# Patient Record
Sex: Female | Born: 1960
Health system: Southern US, Community
[De-identification: ages and names within clinical notes are randomized; demographics above are authoritative.]

## PROBLEM LIST (undated history)

## (undated) DIAGNOSIS — Z9889 Other specified postprocedural states: Secondary | ICD-10-CM

## (undated) DIAGNOSIS — R112 Nausea with vomiting, unspecified: Secondary | ICD-10-CM

## (undated) DIAGNOSIS — M1990 Unspecified osteoarthritis, unspecified site: Secondary | ICD-10-CM

## (undated) DIAGNOSIS — F419 Anxiety disorder, unspecified: Secondary | ICD-10-CM

## (undated) DIAGNOSIS — F329 Major depressive disorder, single episode, unspecified: Secondary | ICD-10-CM

## (undated) DIAGNOSIS — R7303 Prediabetes: Secondary | ICD-10-CM

## (undated) DIAGNOSIS — T4145XA Adverse effect of unspecified anesthetic, initial encounter: Secondary | ICD-10-CM

## (undated) DIAGNOSIS — T8859XA Other complications of anesthesia, initial encounter: Secondary | ICD-10-CM

## (undated) DIAGNOSIS — F32A Depression, unspecified: Secondary | ICD-10-CM

## (undated) DIAGNOSIS — K219 Gastro-esophageal reflux disease without esophagitis: Secondary | ICD-10-CM

## (undated) DIAGNOSIS — T7840XA Allergy, unspecified, initial encounter: Secondary | ICD-10-CM

## (undated) DIAGNOSIS — Z973 Presence of spectacles and contact lenses: Secondary | ICD-10-CM

## (undated) HISTORY — DX: Allergy, unspecified, initial encounter: T78.40XA

## (undated) HISTORY — DX: Gastro-esophageal reflux disease without esophagitis: K21.9

## (undated) HISTORY — DX: Major depressive disorder, single episode, unspecified: F32.9

## (undated) HISTORY — DX: Depression, unspecified: F32.A

## (undated) HISTORY — DX: Anxiety disorder, unspecified: F41.9

## (undated) HISTORY — PX: JOINT REPLACEMENT: SHX530

## (undated) HISTORY — DX: Unspecified osteoarthritis, unspecified site: M19.90

---

## 1987-11-23 HISTORY — PX: BREAST BIOPSY: SHX20

## 1999-05-05 ENCOUNTER — Other Ambulatory Visit: Admission: RE | Admit: 1999-05-05 | Discharge: 1999-05-05 | Payer: Self-pay | Admitting: Obstetrics & Gynecology

## 1999-08-25 ENCOUNTER — Encounter: Payer: Self-pay | Admitting: Obstetrics & Gynecology

## 1999-08-25 ENCOUNTER — Ambulatory Visit (HOSPITAL_COMMUNITY): Admission: RE | Admit: 1999-08-25 | Discharge: 1999-08-25 | Payer: Self-pay | Admitting: Obstetrics & Gynecology

## 2000-05-05 ENCOUNTER — Encounter (INDEPENDENT_AMBULATORY_CARE_PROVIDER_SITE_OTHER): Payer: Self-pay

## 2000-05-05 ENCOUNTER — Other Ambulatory Visit: Admission: RE | Admit: 2000-05-05 | Discharge: 2000-05-05 | Payer: Self-pay | Admitting: Obstetrics and Gynecology

## 2001-05-15 ENCOUNTER — Other Ambulatory Visit: Admission: RE | Admit: 2001-05-15 | Discharge: 2001-05-15 | Payer: Self-pay | Admitting: Obstetrics and Gynecology

## 2002-02-13 ENCOUNTER — Encounter: Payer: Self-pay | Admitting: Obstetrics and Gynecology

## 2002-02-13 ENCOUNTER — Ambulatory Visit (HOSPITAL_COMMUNITY): Admission: RE | Admit: 2002-02-13 | Discharge: 2002-02-13 | Payer: Self-pay | Admitting: Obstetrics and Gynecology

## 2002-04-24 ENCOUNTER — Ambulatory Visit (HOSPITAL_COMMUNITY): Admission: RE | Admit: 2002-04-24 | Discharge: 2002-04-24 | Payer: Self-pay | Admitting: Obstetrics and Gynecology

## 2002-04-24 ENCOUNTER — Encounter: Payer: Self-pay | Admitting: Obstetrics and Gynecology

## 2002-07-24 ENCOUNTER — Other Ambulatory Visit: Admission: RE | Admit: 2002-07-24 | Discharge: 2002-07-24 | Payer: Self-pay | Admitting: Obstetrics and Gynecology

## 2003-07-02 ENCOUNTER — Encounter: Payer: Self-pay | Admitting: Obstetrics and Gynecology

## 2003-07-02 ENCOUNTER — Ambulatory Visit (HOSPITAL_COMMUNITY): Admission: RE | Admit: 2003-07-02 | Discharge: 2003-07-02 | Payer: Self-pay | Admitting: Obstetrics and Gynecology

## 2003-07-09 ENCOUNTER — Encounter: Payer: Self-pay | Admitting: Obstetrics and Gynecology

## 2003-07-09 ENCOUNTER — Encounter: Admission: RE | Admit: 2003-07-09 | Discharge: 2003-07-09 | Payer: Self-pay | Admitting: Obstetrics and Gynecology

## 2003-08-05 ENCOUNTER — Other Ambulatory Visit: Admission: RE | Admit: 2003-08-05 | Discharge: 2003-08-05 | Payer: Self-pay | Admitting: Obstetrics and Gynecology

## 2004-09-23 ENCOUNTER — Ambulatory Visit (HOSPITAL_COMMUNITY): Admission: RE | Admit: 2004-09-23 | Discharge: 2004-09-23 | Payer: Self-pay | Admitting: Obstetrics and Gynecology

## 2004-11-03 ENCOUNTER — Other Ambulatory Visit: Admission: RE | Admit: 2004-11-03 | Discharge: 2004-11-03 | Payer: Self-pay | Admitting: Obstetrics and Gynecology

## 2006-01-12 ENCOUNTER — Ambulatory Visit (HOSPITAL_COMMUNITY): Admission: RE | Admit: 2006-01-12 | Discharge: 2006-01-12 | Payer: Self-pay | Admitting: Obstetrics and Gynecology

## 2006-01-12 ENCOUNTER — Other Ambulatory Visit: Admission: RE | Admit: 2006-01-12 | Discharge: 2006-01-12 | Payer: Self-pay | Admitting: Obstetrics and Gynecology

## 2007-01-19 ENCOUNTER — Ambulatory Visit (HOSPITAL_COMMUNITY): Admission: RE | Admit: 2007-01-19 | Discharge: 2007-01-19 | Payer: Self-pay | Admitting: Obstetrics and Gynecology

## 2008-02-19 ENCOUNTER — Ambulatory Visit (HOSPITAL_COMMUNITY): Admission: RE | Admit: 2008-02-19 | Discharge: 2008-02-19 | Payer: Self-pay | Admitting: Obstetrics and Gynecology

## 2009-03-13 ENCOUNTER — Ambulatory Visit (HOSPITAL_COMMUNITY): Admission: RE | Admit: 2009-03-13 | Discharge: 2009-03-13 | Payer: Self-pay | Admitting: Obstetrics and Gynecology

## 2010-09-07 ENCOUNTER — Ambulatory Visit (HOSPITAL_COMMUNITY): Admission: RE | Admit: 2010-09-07 | Discharge: 2010-09-07 | Payer: Self-pay | Admitting: Obstetrics and Gynecology

## 2010-12-13 ENCOUNTER — Encounter: Payer: Self-pay | Admitting: Obstetrics and Gynecology

## 2011-04-01 ENCOUNTER — Encounter: Payer: Self-pay | Admitting: Internal Medicine

## 2011-04-01 ENCOUNTER — Ambulatory Visit (INDEPENDENT_AMBULATORY_CARE_PROVIDER_SITE_OTHER): Payer: 59 | Admitting: Internal Medicine

## 2011-04-01 VITALS — BP 126/86 | HR 80 | Temp 98.6°F | Ht 66.0 in | Wt 227.2 lb

## 2011-04-01 DIAGNOSIS — R7309 Other abnormal glucose: Secondary | ICD-10-CM

## 2011-04-01 DIAGNOSIS — E162 Hypoglycemia, unspecified: Secondary | ICD-10-CM

## 2011-04-01 DIAGNOSIS — E161 Other hypoglycemia: Secondary | ICD-10-CM

## 2011-04-01 NOTE — Progress Notes (Signed)
  Subjective:    Patient ID: Tracy Pitts, female    DOB: 1961/07/24, 50 y.o.   MRN: [id number]  HPI Her fasting glucose was 140 on screening exam work in 07/2010. She has been monitoring glucoses with her mother 's glucometer. Fasting blood sugar range:101- 126 Fasting blood sugar average:not averaged Highest glucose 2 hours postprandially:<180 Hypoglycemia ; 52 with shaking 2 hrs after a Pop Tart   Polyuria/polydipsia/polyphagia:no Postural symptoms (lightheadedness):no Chest pain/palpitations/claudication:no Skin lesions/ulcers:no Numbness, tingling, burning extremities:numbness R thumb & index & middle fingers, related to head position, especially in ams Weight change:decreasing with TLC Vision change ( blurring, diplopia, vision loss):no Exercise:walking 1-2 mpd 2-3 X/ week Nutrition/diet:decreased sweets & carbs Medication compliance:on no meds Eye exam:02/2011, no retinopathy Footcare:no A1 C./urine microalbumin:none to date.  Family history diabetes colon and terminal grandmother. Tracy Pitts  had gestational diabetes.       Review of Systems     Objective:   Physical Exam Gen.: Healthy and well-nourished in appearance. Alert, appropriate and cooperative throughout exam. Neck: No deformities, masses, or tenderness noted. Range of motion  normal. Thyroid  Normal w/o nodules. Lungs: Normal respiratory effort; chest expands symmetrically. Lungs are clear to auscultation without rales, wheezes, or increased work of breathing. Heart: Normal rate and rhythm. Normal S1 and S2. No gallop, click, or rub. S4  w/o  murmur. No clubbing, cyanosis, edema, or deformity noted. Tone & strength  normal.Joints normal; insignificant DIP finger changes. Nail health  good. Vascular: Carotid, radial artery, dorsalis pedis and dorsalis posterior tibial pulses are full and equal. No bruits present. Neurologic: Alert and oriented x3. Deep tendon reflexes symmetrical and normal. Negative  Tinel's.  Light touch normal over feet.   Skin: Intact without suspicious lesions or rashes. Lymph: No cervical, axillary, or inguinal lymphadenopathy present. Psych: Mood and affect are normal. Normally interactive                                                                                         Assessment & Plan:  #1 fasting hyperglycemia  #2 reactive hypoglycemia with high  glycemic carb  intake   #3 intermittent  C6 radiculopathy without neuromuscular deficit  Plan: #1 A1 C., urine microalbumin  #2 low glycemic nutritional program such as the [employer] or [place].  #3 consider a memory foam cervical pillow to prevent C6 nerve impingement

## 2011-04-01 NOTE — Patient Instructions (Signed)
Preventive Health Care: Exercise  30-45  minutes a day, 3-4 days a week. Walking is especially valuable in preventing Osteoporosis. Eat a low-fat diet with lots of fruits and vegetables, up to 7-9 servings per day. Avoid obesity; your goal = waist less than 35 inches.Consume less than 30 grams of sugar per day from foods & drinks with High Fructose Corn Syrup as #2,3 or #4 on label. Diabetes Monitor   The A1c test checks the average amount of glucose (sugar) in the blood over the last 2 to 3 months.As glucose circulates in the blood, some of it binds to hemoglobin A. This is the main form of hemoglobin in adults. Hemoglobin is a red protein that carries oxygen in the red blood cells (RBC's). Once the glucose is bound to the hemoglobin A, it remains there for the life of the red blood cell (about 120 days). This combination of glucose and hemoglobin A is called A1c (or hemoglobin A1c or glycohemoglobin). Increased glucose in the blood, increases the hemoglobin A1c. A1c levels do not change quickly but will shift as older RBC's die and younger ones take their place.   The A1c test is used primarily to monitor the glucose control of diabetics over time. The goal of those with diabetes is to keep their blood glucose levels as close to normal as possible. This helps to minimize the complications caused by chronically elevated glucose levels, such as progressive damage to body organs like the kidneys, eyes, cardiovascular system, and nerves. The A1c test gives a picture of the average amount of glucose in the blood over the last few months. It can help a patient and his doctor know if the measures they are taking to control the patient's diabetes are successful or need to be adjusted.     Depending on the type of diabetes that you have, how well your diabetes is controlled, your A1c may be measured 2 to 4 times each year. When someone is first diagnosed with diabetes or if control is not good, A1c may be ordered  more frequently.   NORMAL VALUES  Non diabetic adults: 5 %-6.1%  Good diabetic control: 6.2-6.4 %  Fair diabetic control: 6.5-7%  Poor diabetic control: greater than 7 % ( except with additional factors such as  advanced age; significant coronary or neurologic disease,etc). Check the A1c every 6 months if it is < 6.5%; every 4 months if  6.5% or higher. Goals for home glucose monitoring are : fasting  or morning glucose goal of  90-150. Two hours after any meal , goal = < 180, preferably < 150.      Marland Kitchen

## 2011-04-02 LAB — HEMOGLOBIN A1C: Hgb A1c MFr Bld: 6.2 % (ref 4.6–6.5)

## 2011-04-02 LAB — MICROALBUMIN / CREATININE URINE RATIO: Creatinine,U: 190.5 mg/dL

## 2011-09-27 ENCOUNTER — Other Ambulatory Visit (HOSPITAL_COMMUNITY): Payer: Self-pay | Admitting: Obstetrics and Gynecology

## 2011-09-27 DIAGNOSIS — Z1231 Encounter for screening mammogram for malignant neoplasm of breast: Secondary | ICD-10-CM

## 2011-11-01 ENCOUNTER — Ambulatory Visit (HOSPITAL_COMMUNITY)
Admission: RE | Admit: 2011-11-01 | Discharge: 2011-11-01 | Disposition: A | Discharge status: Home / Self Care | Payer: 59 | Source: Ambulatory Visit | Attending: Obstetrics and Gynecology | Admitting: Obstetrics and Gynecology

## 2011-11-01 DIAGNOSIS — Z1231 Encounter for screening mammogram for malignant neoplasm of breast: Secondary | ICD-10-CM | POA: Insufficient documentation

## 2012-08-31 ENCOUNTER — Telehealth: Payer: Self-pay | Admitting: Internal Medicine

## 2012-08-31 ENCOUNTER — Encounter: Payer: Self-pay | Admitting: Internal Medicine

## 2012-08-31 ENCOUNTER — Ambulatory Visit (INDEPENDENT_AMBULATORY_CARE_PROVIDER_SITE_OTHER): Payer: 59 | Admitting: Internal Medicine

## 2012-08-31 VITALS — BP 130/80 | HR 71 | Temp 98.3°F | Wt 231.2 lb

## 2012-08-31 DIAGNOSIS — IMO0002 Reserved for concepts with insufficient information to code with codable children: Secondary | ICD-10-CM

## 2012-08-31 DIAGNOSIS — M5414 Radiculopathy, thoracic region: Secondary | ICD-10-CM

## 2012-08-31 MED ORDER — GABAPENTIN 100 MG PO CAPS: ORAL_CAPSULE | ORAL | Status: DC

## 2012-08-31 MED ORDER — FAMCICLOVIR 500 MG PO TABS: 500.0000 mg | ORAL_TABLET | Freq: Three times a day (TID) | ORAL | Status: DC

## 2012-08-31 NOTE — Telephone Encounter (Signed)
Spoke with patient, offered appointment today with Dr.Hopper

## 2012-08-31 NOTE — Patient Instructions (Addendum)
Assess response to the gabapentin one every 8 hours as needed. If it is partially beneficial, it can be increased up to a total of 3 pills every 8 hours as needed. This increase of 1 pill each dose  should take place over 48 hours at least.

## 2012-08-31 NOTE — Telephone Encounter (Signed)
Caller: Kim/Patient; Patient Name: Tracy Pitts; PCP: Marga Melnick; Best Callback Phone Number: [phone]. Onset 08/27/12 Patient states she has had right flank pain and was seen  by Dr Retta Diones and CT Scan negative for stones, UA negative.  Afebrile, nausea, right flank pain 5-6 on pain scale.  Dr Retta Diones advised patient to contact her PCP for follow up.  All emergent symptoms ruled out per Flank Pain protocol with exception " All other situations."  Home care advice given.  PLEASE F/U WITH PATIENT CONCERNING POSSIBLE APPT. THANK YOU.

## 2012-08-31 NOTE — Progress Notes (Signed)
  Subjective:    Patient ID: Tracy Pitts, female    DOB: [birth year], 51 y.o.   MRN: [id number]  HPI FLANK PAIN: Sudden onset of right flank constant pain 5-6/10, pain started on 08/27/12 as aching, colicky with burning and  radiation to R anterior abdomen. Worse while sitting and putting weight on right buttock.  Taking ibuprofen & percocet which was prescribed by Dr. Retta Diones which does not relieve the pain. No relief with heating pad.  She states that her clothes tend to exacerbate the burning sensation intermittently  CT urogram and urinalysis were negative by the urologist  Childhood chicken pox.        Review of Systems  Nausea/Vomiting:  Waves of nausea but denies emesis Diarrhea: No Constipation: No Melena/BRBPR: No Hematemesis: No Anorexia:No Fever/Chills: Denies fever;she  had chills on Monday at work Dysuria/Hematuria/Pyuria: No Rash: No Wt loss: No Vaginal bleeding: No, last pap February 2013 Past Surgeries: C-Section 1994, Breast biopsy 1990 benign cyst         Objective:   Physical Exam General appearance is one of good health and nourishment w/o distress.  Eyes: No conjunctival inflammation or scleral icterus is present.  Oral exam: Dental hygiene is good; lips and gums are healthy appearing.There is no oropharyngeal erythema or exudate noted.  Mucocoele of uvula  Heart:  Normal rate and regular rhythm. S1 and S2 normal without gallop, murmur, click, rub or other extra sounds     Lungs:Chest clear to auscultation; no wheezes, rhonchi,rales ,or rubs present.No increased work of breathing.   Abdomen: bowel sounds normal, soft and non-tender without masses, organomegaly or hernias noted.  No guarding or rebound   Skin:Warm & dry.  Intact without suspicious lesions or rashes ; no jaundice or tenting  Lymphatic: No lymphadenopathy is noted about the head, neck, axilla areas.   Musculoskeletal/Extremities: Gait including tiptoe and heel walking; range of  motion; stability; muscle strength; and  muscle tone are normal. Nail health is good. There are  no significant deformities of the digits. No cyanosis, clubbing or edema are present.   She is able to lie flat and sit up without help. Negative straight leg raising bilaterally             Assessment & Plan:  #1 right flank pain with abdominal radiation. Clinical exam does not suggest thoracic radiculopathy. The history is suggestive of zoster; but there is no rash present. This is also suggested by the sensitivity of the area to her clothing and lack of significant response to the narcotic.  Plan: See orders and recommendations

## 2012-09-08 ENCOUNTER — Encounter: Payer: Self-pay | Admitting: Internal Medicine

## 2012-09-08 NOTE — Telephone Encounter (Signed)
Patient states she is feeling better although she is still broken out and has some pain. She would like to go back to work tomorrow and wants to know if that would be okay. Call pt back @ [phone]

## 2012-09-08 NOTE — Telephone Encounter (Signed)
Spoke with pt. She states she is going to keep taking the gabapentin prn. She states she feels a littler better. Pt states she will talk to her work & give [place] a call on Monday to let us know how she is & to check on the work note/fmla.

## 2012-09-08 NOTE — Telephone Encounter (Signed)
Left msg on pt's vmail calling to check on her per dr. Alwyn Ren. (staff msg)

## 2012-09-14 ENCOUNTER — Telehealth: Payer: Self-pay | Admitting: Internal Medicine

## 2012-09-14 NOTE — Telephone Encounter (Signed)
Paperwork was completed by MD and given to Amy for completion process

## 2012-09-14 NOTE — Telephone Encounter (Signed)
PATIENT called today needs to know status of FMLA paperwork dropped off 10.14.13---call back# is work# [phone]

## 2012-09-14 NOTE — Telephone Encounter (Signed)
On desk top  

## 2012-09-14 NOTE — Telephone Encounter (Signed)
Dr.Hopper please advise, I have not seen any FMLA paperwork on this patient

## 2012-12-04 ENCOUNTER — Other Ambulatory Visit (HOSPITAL_COMMUNITY): Payer: Self-pay | Admitting: Obstetrics and Gynecology

## 2012-12-04 DIAGNOSIS — Z1231 Encounter for screening mammogram for malignant neoplasm of breast: Secondary | ICD-10-CM

## 2012-12-12 ENCOUNTER — Ambulatory Visit (HOSPITAL_COMMUNITY): Payer: 59

## 2012-12-26 ENCOUNTER — Ambulatory Visit (HOSPITAL_COMMUNITY): Payer: 59

## 2013-01-01 ENCOUNTER — Encounter: Payer: Self-pay | Admitting: Internal Medicine

## 2013-01-06 ENCOUNTER — Other Ambulatory Visit: Payer: Self-pay

## 2013-01-10 ENCOUNTER — Ambulatory Visit (HOSPITAL_COMMUNITY): Payer: 59

## 2013-02-08 ENCOUNTER — Ambulatory Visit (AMBULATORY_SURGERY_CENTER): Payer: 59 | Admitting: *Deleted

## 2013-02-08 VITALS — Ht 66.0 in | Wt 235.8 lb

## 2013-02-08 MED ORDER — PEG-KCL-NACL-NASULF-NA ASC-C 100 G PO SOLR: ORAL | Status: DC

## 2013-02-08 NOTE — Progress Notes (Signed)
No egg or soy allergy 

## 2013-02-16 ENCOUNTER — Ambulatory Visit
Admission: RE | Admit: 2013-02-16 | Discharge: 2013-02-16 | Disposition: A | Discharge status: Home / Self Care | Payer: 59 | Source: Ambulatory Visit | Attending: Obstetrics and Gynecology | Admitting: Obstetrics and Gynecology

## 2013-02-16 DIAGNOSIS — Z1231 Encounter for screening mammogram for malignant neoplasm of breast: Secondary | ICD-10-CM

## 2013-02-21 ENCOUNTER — Ambulatory Visit (AMBULATORY_SURGERY_CENTER): Payer: 59 | Admitting: Internal Medicine

## 2013-02-21 ENCOUNTER — Encounter: Payer: Self-pay | Admitting: Internal Medicine

## 2013-02-21 VITALS — BP 133/66 | HR 65 | Temp 98.2°F | Resp 10 | Ht 66.0 in | Wt 235.0 lb

## 2013-02-21 DIAGNOSIS — Z1211 Encounter for screening for malignant neoplasm of colon: Secondary | ICD-10-CM

## 2013-02-21 DIAGNOSIS — K5730 Diverticulosis of large intestine without perforation or abscess without bleeding: Secondary | ICD-10-CM

## 2013-02-21 HISTORY — PX: COLONOSCOPY WITH PROPOFOL: SHX5780

## 2013-02-21 MED ORDER — SODIUM CHLORIDE 0.9 % IV SOLN: 500.0000 mL | INTRAVENOUS | Status: DC

## 2013-02-21 NOTE — Op Note (Signed)
[facility] [address] [place], [address]   COLONOSCOPY PROCEDURE REPORT  PATIENT: Tracy, Pitts  MR#: [id number] BIRTHDATE: 1961/08/03 , 52  yrs. old GENDER: Female ENDOSCOPIST: Roxy Cedar, MD REFERRED NF:AOZHY Richardson, M.D. PROCEDURE DATE:  02/21/2013 PROCEDURE:   Colonoscopy, screening ASA CLASS:   Class II INDICATIONS:average risk screening. MEDICATIONS: MAC sedation, administered by CRNA and propofol (Diprivan) 400mg  IV  DESCRIPTION OF PROCEDURE:   After the risks benefits and alternatives of the procedure were thoroughly explained, informed consent was obtained.  A digital rectal exam revealed no abnormalities of the rectum.   The LB [id number]  endoscope was introduced through the anus and advanced to the cecum, which was identified by both the appendix and ileocecal valve. No adverse events experienced.   The quality of the prep was good, using MoviPrep  The instrument was then slowly withdrawn as the colon was fully examined.      COLON FINDINGS: Moderate diverticulosis was noted in the sigmoid colon.   The colon was otherwise normal.  There was no inflammation, polyps or cancers .  Retroflexed views revealed internal hemorrhoids. The time to cecum=2 minutes 46 seconds. Withdrawal time=8 minutes 33 seconds.  The scope was withdrawn and the procedure completed.  COMPLICATIONS: There were no complications.  ENDOSCOPIC IMPRESSION: 1.   Moderate diverticulosis was noted in the sigmoid colon 2.   The colon was otherwise normal  RECOMMENDATIONS: 1. Continue current colorectal screening recommendations for "routine risk" patients with a repeat colonoscopy in 10 years.   eSigned:  Roxy Cedar, MD 02/21/2013 9:25 AM   cc: Huel Cote, MD, Pecola Lawless, MD, and The Patient

## 2013-02-21 NOTE — Progress Notes (Signed)
Patient did not experience any of the following events: a burn prior to discharge; a fall within the facility; wrong site/side/patient/procedure/implant event; or a hospital transfer or hospital admission upon discharge from the facility. (G8907) Patient did not have preoperative order for IV antibiotic SSI prophylaxis. (G8918)  

## 2013-02-21 NOTE — Patient Instructions (Addendum)

## 2013-02-22 ENCOUNTER — Telehealth: Payer: Self-pay | Admitting: *Deleted

## 2013-02-22 NOTE — Telephone Encounter (Signed)
a Follow up Call-  Call back number 02/21/2013  Post procedure Call Back phone  # [phone]  Permission to leave phone message Yes     Patient questions:  Do you have a fever, pain , or abdominal swelling? no Pain Score  0 *  Have you tolerated food without any problems? yes  Have you been able to return to your normal activities? yes  Do you have any questions about your discharge instructions: Diet   no Medications  no Follow up visit  no  Do you have questions or concerns about your Care? no  Actions: * If pain score is 4 or above: No action needed, pain <4.

## 2013-09-27 ENCOUNTER — Other Ambulatory Visit: Payer: Self-pay

## 2014-12-25 ENCOUNTER — Encounter: Payer: Self-pay | Admitting: Internal Medicine

## 2014-12-25 ENCOUNTER — Other Ambulatory Visit: Payer: Self-pay | Admitting: Internal Medicine

## 2014-12-25 ENCOUNTER — Other Ambulatory Visit (INDEPENDENT_AMBULATORY_CARE_PROVIDER_SITE_OTHER): Payer: 59

## 2014-12-25 ENCOUNTER — Ambulatory Visit (INDEPENDENT_AMBULATORY_CARE_PROVIDER_SITE_OTHER): Payer: 59 | Admitting: Internal Medicine

## 2014-12-25 VITALS — BP 132/98 | HR 72 | Temp 98.3°F | Resp 15 | Ht 66.0 in | Wt 234.0 lb

## 2014-12-25 DIAGNOSIS — Z0000 Encounter for general adult medical examination without abnormal findings: Secondary | ICD-10-CM

## 2014-12-25 DIAGNOSIS — R7309 Other abnormal glucose: Secondary | ICD-10-CM

## 2014-12-25 DIAGNOSIS — R1010 Upper abdominal pain, unspecified: Secondary | ICD-10-CM

## 2014-12-25 DIAGNOSIS — F419 Anxiety disorder, unspecified: Secondary | ICD-10-CM

## 2014-12-25 DIAGNOSIS — R1011 Right upper quadrant pain: Secondary | ICD-10-CM

## 2014-12-25 DIAGNOSIS — Z0189 Encounter for other specified special examinations: Secondary | ICD-10-CM

## 2014-12-25 DIAGNOSIS — F329 Major depressive disorder, single episode, unspecified: Secondary | ICD-10-CM | POA: Insufficient documentation

## 2014-12-25 DIAGNOSIS — R7303 Prediabetes: Secondary | ICD-10-CM | POA: Insufficient documentation

## 2014-12-25 LAB — MICROALBUMIN / CREATININE URINE RATIO
Creatinine,U: 61.5 mg/dL
MICROALB/CREAT RATIO: 1.1 mg/g (ref 0.0–30.0)
Microalb, Ur: <0.7 mg/dL (ref 0.0–1.9)

## 2014-12-25 LAB — CBC WITH DIFFERENTIAL/PLATELET
BASOS PCT: 0.6 % (ref 0.0–3.0)
Basophils Absolute: 0 10*3/uL (ref 0.0–0.1)
Eosinophils Absolute: 0.1 10*3/uL (ref 0.0–0.7)
Eosinophils Relative: 2.2 % (ref 0.0–5.0)
HCT: 42.8 % (ref 36.0–46.0)
HEMOGLOBIN: 14.8 g/dL (ref 12.0–15.0)
LYMPHS ABS: 1.7 10*3/uL (ref 0.7–4.0)
Lymphocytes Relative: 31.7 % (ref 12.0–46.0)
MCHC: 34.4 g/dL (ref 30.0–36.0)
MCV: 90.3 fl (ref 78.0–100.0)
MONOS PCT: 8.2 % (ref 3.0–12.0)
Monocytes Absolute: 0.5 10*3/uL (ref 0.1–1.0)
Neutro Abs: 3.1 10*3/uL (ref 1.4–7.7)
Neutrophils Relative %: 57.3 % (ref 43.0–77.0)
PLATELETS: 260 10*3/uL (ref 150.0–400.0)
RBC: 4.74 Mil/uL (ref 3.87–5.11)
RDW: 13.6 % (ref 11.5–15.5)
WBC: 5.5 10*3/uL (ref 4.0–10.5)

## 2014-12-25 LAB — BASIC METABOLIC PANEL
BUN: 16 mg/dL (ref 6–23)
CO2: 29 mEq/L (ref 19–32)
CREATININE: 0.84 mg/dL (ref 0.40–1.20)
Calcium: 9.4 mg/dL (ref 8.4–10.5)
Chloride: 105 mEq/L (ref 96–112)
GFR: 75.4 mL/min (ref >60.00)
Glucose, Bld: 97 mg/dL (ref 70–99)
Potassium: 4.7 mEq/L (ref 3.5–5.1)
Sodium: 139 mEq/L (ref 135–145)

## 2014-12-25 LAB — HEPATIC FUNCTION PANEL
ALBUMIN: 4.3 g/dL (ref 3.5–5.2)
ALT: 85 U/L — ABNORMAL HIGH (ref 0–35)
AST: 21 U/L (ref 0–37)
Alkaline Phosphatase: 78 U/L (ref 39–117)
Bilirubin, Direct: 0 mg/dL (ref 0.0–0.3)
Total Bilirubin: 0.4 mg/dL (ref 0.2–1.2)
Total Protein: 7.1 g/dL (ref 6.0–8.3)

## 2014-12-25 LAB — HEMOGLOBIN A1C: HEMOGLOBIN A1C: 6.1 % (ref 4.6–6.5)

## 2014-12-25 LAB — TSH: TSH: 2.39 u[IU]/mL (ref 0.35–4.50)

## 2014-12-25 NOTE — Progress Notes (Signed)
Pre visit review using our clinic review tool, if applicable. No additional management support is needed unless otherwise documented below in the visit note. 

## 2014-12-25 NOTE — Patient Instructions (Signed)
Your next office appointment will be determined based upon review of your pending labs & ultrasound. Those instructions will be transmitted to you through My Chart  Critical values will be called   Followup as needed for your acute issue. Please report any significant change in your symptoms. Reflux of gastric acid may be asymptomatic as this may occur mainly during sleep.The triggers for reflux  include stress; the "aspirin family" ; alcohol; peppermint; and caffeine (coffee, tea, cola, and chocolate). The aspirin family would include aspirin and the nonsteroidal agents such as ibuprofen &  Naproxen. Tylenol would not cause reflux. If having symptoms ; food & drink should be avoided for @ least 2 hours before going to bed.

## 2014-12-25 NOTE — Progress Notes (Signed)
Subjective:    Patient ID: Tracy Pitts, female    DOB: 1961-08-21, 54 y.o.   MRN: [id number]  HPI   She is here for a physical;acute issues include abdominal pain.  She has had intermittent upper abdominal pain across the abdomen;especially in RUQ 4-5 X over past 2-3 mos. The trigger is eating fried foods. The pain can last hours; most recently 1/29-1/31/16. Zantac & TUMS did not help. She denies dysphagia, unexplained weight loss, melena, or rectal bleeding.  Her A1c was in the prediabetic range at 6.1% her gynecologist. Originally metformin 500 mg twice a day was prescribed but she felt she was having some hypoglycemia. She is now on 500 mg after breakfast.. She is not monitoring glucoses at home.She does have a past history of gestational diabetes.  She is on a low-fat diet. She walks 3 times a week 60-90 minutes without cardiopulmonary symptoms. She does have some edema after working on her feet.    Review of Systems  Persistent polyuria, polyphagia, polydipsia absent.  There is no blurred vision, double vision, or loss of vision.   No postural dizziness noted. RUE numbness which she attributes to cervical disc disease No nonhealing skin lesions present.  Weight is stable.    Chest pain, palpitations, tachycardia, exertional dyspnea, paroxysmal nocturnal dyspnea, or claudication are absent.       Objective:   Physical Exam  Gen.: Adequately nourished in appearance. Alert, appropriate and cooperative throughout exam. As per CDC Guidelines ,Epic documents obesity as being present . Appears younger than stated age  Head: Normocephalic without obvious abnormalities  Eyes: No corneal or conjunctival inflammation noted. Pupils equal round reactive to light and accommodation. Extraocular motion intact.  Ears: External  ear exam reveals no significant lesions or deformities. Canals clear .TMs normal. Hearing is grossly normal bilaterally. Nose: External nasal exam reveals no  deformity or inflammation. Nasal mucosa are pink and moist. No lesions or exudates noted.   Mouth: Oral mucosa and oropharynx reveal no lesions or exudates. Teeth in good repair. Neck: No deformities, masses, or tenderness noted. Range of motion & Thyroid normal. Lungs: Normal respiratory effort; chest expands symmetrically. Lungs are clear to auscultation without rales, wheezes, or increased work of breathing. Heart: Normal rate and rhythm. Normal S1 and S2. No gallop, click, or rub. No murmur. Abdomen: Bowel sounds normal; abdomen soft and nontender. No masses, organomegaly or hernias noted. Genitalia:  as per Gyn                                  Musculoskeletal/extremities: No deformity or scoliosis noted of  the thoracic or lumbar spine.  No clubbing, cyanosis, edema, or significant extremity  deformity noted.  Range of motion normal . Tone & strength normal. Hand joints normal  Fingernail  health good. Able to lie down & sit up w/o help.  Negative SLR bilaterally Vascular: Carotid, radial artery, dorsalis pedis and  posterior tibial pulses are full and equal. No bruits present. Neurologic: Alert and oriented x3. Deep tendon reflexes symmetrical and normal.  Gait normal       Skin: Intact without suspicious lesions or rashes. Lymph: No cervical, axillary lymphadenopathy present. Psych: Mood and affect are normal. Normally interactive  Assessment & Plan:  #1 comprehensive physical exam; no acute findings #2 recurrent abdominal pain ; R/O gall bladder disease #3 pre diabetes  Plan: see Orders  & Recommendations

## 2014-12-27 ENCOUNTER — Encounter: Payer: Self-pay | Admitting: Internal Medicine

## 2014-12-27 DIAGNOSIS — E785 Hyperlipidemia, unspecified: Secondary | ICD-10-CM | POA: Insufficient documentation

## 2014-12-27 LAB — NMR LIPOPROFILE WITH LIPIDS
CHOLESTEROL, TOTAL: 236 mg/dL — AB (ref 100–199)
HDL Particle Number: 41.9 umol/L (ref >=30.5)
HDL Size: 9.2 nm (ref >=9.2)
HDL-C: 67 mg/dL (ref >39)
LARGE HDL: 7.8 umol/L (ref >=4.8)
LDL (calc): 142 mg/dL — ABNORMAL HIGH (ref 0–99)
LDL PARTICLE NUMBER: 1577 nmol/L — AB (ref <1000)
LDL Size: 22.3 nm (ref >=20.8)
LP-IR Score: 32 (ref <=45)
Large VLDL-P: 2.2 nmol/L (ref <=2.7)
Small LDL Particle Number: 151 nmol/L (ref <=527)
TRIGLYCERIDES: 133 mg/dL (ref 0–149)
VLDL Size: 45.9 nm (ref <=46.6)

## 2015-01-03 ENCOUNTER — Telehealth: Payer: Self-pay | Admitting: Internal Medicine

## 2015-01-03 ENCOUNTER — Ambulatory Visit
Admission: RE | Admit: 2015-01-03 | Discharge: 2015-01-03 | Disposition: A | Discharge status: Home / Self Care | Payer: 59 | Source: Ambulatory Visit | Attending: Internal Medicine | Admitting: Internal Medicine

## 2015-01-03 DIAGNOSIS — R1011 Right upper quadrant pain: Secondary | ICD-10-CM

## 2015-01-06 ENCOUNTER — Other Ambulatory Visit: Payer: Self-pay | Admitting: Internal Medicine

## 2015-01-06 DIAGNOSIS — K8020 Calculus of gallbladder without cholecystitis without obstruction: Secondary | ICD-10-CM

## 2015-01-06 NOTE — Telephone Encounter (Signed)
Patient calling to advise that she has two surgeons in mind...  Dr. Harlow Asa Or Dr. Lu Duffel surgery

## 2015-01-06 NOTE — Telephone Encounter (Signed)
Referral made 

## 2015-01-07 ENCOUNTER — Encounter: Payer: Self-pay | Admitting: Internal Medicine

## 2015-01-21 ENCOUNTER — Ambulatory Visit (INDEPENDENT_AMBULATORY_CARE_PROVIDER_SITE_OTHER): Payer: Self-pay | Admitting: Surgery

## 2015-02-10 ENCOUNTER — Encounter: Payer: Self-pay | Admitting: Internal Medicine

## 2015-02-10 ENCOUNTER — Other Ambulatory Visit: Payer: Self-pay | Admitting: Internal Medicine

## 2015-02-10 DIAGNOSIS — R7401 Elevation of levels of liver transaminase levels: Secondary | ICD-10-CM

## 2015-02-10 DIAGNOSIS — R740 Nonspecific elevation of levels of transaminase and lactic acid dehydrogenase [LDH]: Secondary | ICD-10-CM

## 2015-02-10 DIAGNOSIS — L299 Pruritus, unspecified: Secondary | ICD-10-CM

## 2015-02-11 ENCOUNTER — Encounter (HOSPITAL_BASED_OUTPATIENT_CLINIC_OR_DEPARTMENT_OTHER): Payer: Self-pay | Admitting: *Deleted

## 2015-02-12 ENCOUNTER — Encounter (HOSPITAL_BASED_OUTPATIENT_CLINIC_OR_DEPARTMENT_OTHER): Payer: Self-pay | Admitting: *Deleted

## 2015-02-12 NOTE — Progress Notes (Signed)
NPO AFTER MN. ARRIVE AT 0930. NEEDS ISTAT AND EKG. WILL TAKE CYMBALTA AND WELLBUTRIN AM DOS W/ SIPS OF WATER. REVIEWED [facility] GUIDELINES.

## 2015-02-13 ENCOUNTER — Encounter (HOSPITAL_BASED_OUTPATIENT_CLINIC_OR_DEPARTMENT_OTHER): Payer: Self-pay | Admitting: Surgery

## 2015-02-13 DIAGNOSIS — K8010 Calculus of gallbladder with chronic cholecystitis without obstruction: Secondary | ICD-10-CM | POA: Diagnosis present

## 2015-02-13 NOTE — H&P (Signed)
General Surgery [facility]  Tracy Pitts 01/21/2015 1:24 PM Location: [facility] Patient #: [id number] DOB: 1961-10-05 Married / Language: Tracy Pitts / Race: White Female  History of Present Illness Tracy Regal MD; 01/21/2015 2:01 PM) Patient words: gallbladder.  The patient is a 54 year old female who presents for evaluation of gall stones. Patient is referred by Dr. Unice Cobble for evaluation of symptomatic cholelithiasis. Patient has had intermittent upper abdominal pain. Over the past few months this is becoming more frequent and more intense. Patient denies any fever or chills. She has had nausea but no emesis. By report she had mild elevation of her liver function test. Patient underwent abdominal ultrasound which demonstrates multiple gallstones and gallbladder sludge. There are no acute findings. There was no biliary dilatation. Patient has had previous cesarean section. No other abdominal surgery. Patient denies jaundice or acholic stools. Patient has no history of hepatobiliary or pancreatic disease.   Other Problems Tracy Pitts, CMA; 01/21/2015 1:24 PM) Arthritis Cholelithiasis Depression Gastroesophageal Reflux Disease  Past Surgical History Tracy Pitts, CMA; 01/21/2015 1:24 PM) Breast Biopsy Right. Cesarean Section - 1  Diagnostic Studies History Tracy Pitts, CMA; 01/21/2015 1:24 PM) Colonoscopy within last year Mammogram within last year Pap Smear 1-5 years ago  Allergies Tracy Pitts, CMA; 01/21/2015 1:26 PM) Percocet *ANALGESICS - OPIOID* Vicodin *ANALGESICS - OPIOID*  Medication History ([name], CMA; 01/21/2015 1:27 PM) BuPROPion HCl ER (XL) (300MG  Tablet ER 24HR, Oral) Active. DULoxetine HCl (20MG  Capsule DR Part, Oral) Active. Cymbalta (30MG  Capsule DR Part, Oral) Active. MetFORMIN HCl (500MG  Tablet, Oral) Active. Medications Reconciled  Social History Tracy Pitts, CMA; 01/21/2015 1:24  PM) Alcohol use Occasional alcohol use. Caffeine use Coffee, Tea. No drug use Tobacco use Never smoker.  Family History Tracy Pitts, CMA; 01/21/2015 1:24 PM) Arthritis Father, Mother. Depression Father, Mother, Sister. Hypertension Father, Mother, Sister.  Pregnancy / Birth History Tracy Pitts, [place]; 01/21/2015 1:24 PM) Age at menarche 46 years. Age of menopause 57-50 Gravida 2 Maternal age 74-30 Para 1  Review of Systems ([place]; 01/21/2015 1:24 PM) General Not Present- Appetite Loss, Chills, Fatigue, Fever, Night Sweats, Weight Gain and Weight Loss. Skin Not Present- Change in Wart/Mole, Dryness, Hives, Jaundice, New Lesions, Non-Healing Wounds, Rash and Ulcer. HEENT Present- Seasonal Allergies and Wears glasses/contact lenses. Not Present- Earache, Hearing Loss, Hoarseness, Nose Bleed, Oral Ulcers, Ringing in the Ears, Sinus Pain, Sore Throat, Visual Disturbances and Yellow Eyes. Respiratory Not Present- Bloody sputum, Chronic Cough, Difficulty Breathing, Snoring and Wheezing. Breast Not Present- Breast Mass, Breast Pain, Nipple Discharge and Skin Changes. Cardiovascular Not Present- Chest Pain, Difficulty Breathing Lying Down, Leg Cramps, Palpitations, Rapid Heart Rate, Shortness of Breath and Swelling of Extremities. Gastrointestinal Present- Abdominal Pain, Bloating, Change in Bowel Habits and Nausea. Not Present- Bloody Stool, Chronic diarrhea, Constipation, Difficulty Swallowing, Excessive gas, Gets full quickly at meals, Hemorrhoids, Indigestion, Rectal Pain and Vomiting. Female Genitourinary Not Present- Frequency, Nocturia, Painful Urination, Pelvic Pain and Urgency. Musculoskeletal Present- Joint Pain. Not Present- Back Pain, Joint Stiffness, Muscle Pain, Muscle Weakness and Swelling of Extremities. Neurological Not Present- Decreased Memory, Fainting, Headaches, Numbness, Seizures, Tingling, Tremor, Trouble walking and Weakness. Psychiatric Present-  Depression. Not Present- Anxiety, Bipolar, Change in Sleep Pattern, Fearful and Frequent crying. Endocrine Present- Hot flashes. Not Present- Cold Intolerance, Excessive Hunger, Hair Changes, Heat Intolerance and New Diabetes. Hematology Not Present- Easy Bruising, Excessive bleeding, Gland problems, HIV and Persistent Infections.   Vitals [name] CMA; 01/21/2015  1:25 PM) 01/21/2015 1:25 PM Weight: 236.6 lb Height: 66in Body Surface Area: 2.24 m Body Mass Index: 38.19 kg/m Temp.: 98.4F(Temporal)  Pulse: 79 (Regular)  BP: 128/78 (Sitting, Left Arm, Standard)    Physical Exam Tracy Regal MD; 01/21/2015 2:01 PM) The physical exam findings are as follows: Note:General - appears comfortable, no distress; not diaphorectic  HEENT - normocephalic; sclerae clear, gaze conjugate; mucous membranes moist, dentition good; voice normal  Neck - symmetric on extension; no palpable anterior or posterior cervical adenopathy; no palpable masses in the thyroid bed  Chest - clear bilaterally with rhonchi, rales, or wheeze  Cor - regular rhythm with normal rate; no significant murmur  Abd - soft without distension; nontender; no sign of hernia  Ext - non-tender without significant edema or lymphedema  Neuro - grossly intact; no tremor    Assessment & Plan Tracy Regal MD; 01/21/2015 2:04 PM)  CALCULUS OF GALLBLADDER WITHOUT CHOLECYSTITIS WITHOUT OBSTRUCTION (574.20  K80.20)  I reviewed the above findings with the patient and her husband in detail. We reviewed her ultrasound report. I provided them with written literature on gallbladder surgery to review at home.  Patient has symptomatic cholelithiasis and probable chronic cholecystitis. We discussed laparoscopic cholecystectomy. We discussed the risk and benefits of the procedure. We discussed the possibility of open surgery. We discussed the hospital stay to be anticipated and the postoperative recovery. She understands and  wishes to proceed with surgery in the near future.  The risks and benefits of the procedure have been discussed at length with the patient. The patient understands the proposed procedure, potential alternative treatments, and the course of recovery to be expected. All of the patient's questions have been answered at this time. The patient wishes to proceed with surgery.  Tracy Regal, MD, [facility] Office: [phone]

## 2015-02-17 ENCOUNTER — Ambulatory Visit (HOSPITAL_BASED_OUTPATIENT_CLINIC_OR_DEPARTMENT_OTHER)
Admission: RE | Admit: 2015-02-17 | Discharge: 2015-02-17 | Disposition: A | Discharge status: Home / Self Care | Payer: 59 | Source: Ambulatory Visit | Attending: Surgery | Admitting: Surgery

## 2015-02-17 ENCOUNTER — Ambulatory Visit (HOSPITAL_BASED_OUTPATIENT_CLINIC_OR_DEPARTMENT_OTHER): Payer: 59 | Admitting: Anesthesiology

## 2015-02-17 ENCOUNTER — Ambulatory Visit (HOSPITAL_COMMUNITY): Payer: 59

## 2015-02-17 ENCOUNTER — Encounter (HOSPITAL_BASED_OUTPATIENT_CLINIC_OR_DEPARTMENT_OTHER)
Admission: RE | Disposition: A | Discharge status: Home / Self Care | Payer: Self-pay | Source: Ambulatory Visit | Attending: Surgery

## 2015-02-17 ENCOUNTER — Encounter (HOSPITAL_BASED_OUTPATIENT_CLINIC_OR_DEPARTMENT_OTHER): Payer: Self-pay | Admitting: Anesthesiology

## 2015-02-17 DIAGNOSIS — M1990 Unspecified osteoarthritis, unspecified site: Secondary | ICD-10-CM | POA: Diagnosis not present

## 2015-02-17 DIAGNOSIS — K8020 Calculus of gallbladder without cholecystitis without obstruction: Secondary | ICD-10-CM | POA: Diagnosis present

## 2015-02-17 DIAGNOSIS — F329 Major depressive disorder, single episode, unspecified: Secondary | ICD-10-CM | POA: Diagnosis not present

## 2015-02-17 DIAGNOSIS — E119 Type 2 diabetes mellitus without complications: Secondary | ICD-10-CM | POA: Diagnosis not present

## 2015-02-17 DIAGNOSIS — K8010 Calculus of gallbladder with chronic cholecystitis without obstruction: Secondary | ICD-10-CM | POA: Diagnosis present

## 2015-02-17 DIAGNOSIS — K828 Other specified diseases of gallbladder: Secondary | ICD-10-CM | POA: Insufficient documentation

## 2015-02-17 DIAGNOSIS — K219 Gastro-esophageal reflux disease without esophagitis: Secondary | ICD-10-CM | POA: Diagnosis not present

## 2015-02-17 DIAGNOSIS — Z79899 Other long term (current) drug therapy: Secondary | ICD-10-CM | POA: Insufficient documentation

## 2015-02-17 HISTORY — DX: Prediabetes: R73.03

## 2015-02-17 HISTORY — PX: CHOLECYSTECTOMY: SHX55

## 2015-02-17 HISTORY — DX: Presence of spectacles and contact lenses: Z97.3

## 2015-02-17 LAB — POCT I-STAT 4, (NA,K, GLUC, HGB,HCT)
Glucose, Bld: 107 mg/dL — ABNORMAL HIGH (ref 70–99)
HCT: 43 % (ref 36.0–46.0)
Hemoglobin: 14.6 g/dL (ref 12.0–15.0)
Potassium: 4.7 mmol/L (ref 3.5–5.1)
Sodium: 139 mmol/L (ref 135–145)

## 2015-02-17 SURGERY — LAPAROSCOPIC CHOLECYSTECTOMY WITH INTRAOPERATIVE CHOLANGIOGRAM
Anesthesia: General | Site: Abdomen

## 2015-02-17 MED ORDER — TRAMADOL HCL 50 MG PO TABS
50.0000 mg | ORAL_TABLET | Freq: Four times a day (QID) | ORAL | Status: DC | PRN
Start: 2015-02-17 — End: 2015-02-17
  Administered 2015-02-17: 50 mg via ORAL
  Filled 2015-02-17: qty 1

## 2015-02-17 MED ORDER — HYDROMORPHONE HCL 1 MG/ML IJ SOLN
0.2500 mg | INTRAMUSCULAR | Status: DC | PRN
Start: 2015-02-17 — End: 2015-02-17
  Administered 2015-02-17 (×2): 0.25 mg via INTRAVENOUS
  Filled 2015-02-17: qty 1

## 2015-02-17 MED ORDER — FENTANYL CITRATE 0.05 MG/ML IJ SOLN
INTRAMUSCULAR | Status: AC
  Filled 2015-02-17: qty 4

## 2015-02-17 MED ORDER — CEFAZOLIN SODIUM-DEXTROSE 2-3 GM-% IV SOLR
INTRAVENOUS | Status: AC
  Filled 2015-02-17: qty 50

## 2015-02-17 MED ORDER — BUPIVACAINE-EPINEPHRINE 0.5% -1:200000 IJ SOLN
INTRAMUSCULAR | Status: DC | PRN
Start: 2015-02-17 — End: 2015-02-17
  Administered 2015-02-17: 24 mL

## 2015-02-17 MED ORDER — LACTATED RINGERS IV SOLN
INTRAVENOUS | Status: DC
Start: 2015-02-17 — End: 2015-02-17
  Administered 2015-02-17 (×3): via INTRAVENOUS
  Filled 2015-02-17: qty 1000

## 2015-02-17 MED ORDER — LACTATED RINGERS IV SOLN
INTRAVENOUS | Status: DC | PRN
  Administered 2015-02-17: 3000 mL via INTRAVENOUS

## 2015-02-17 MED ORDER — HYDROMORPHONE HCL 1 MG/ML IJ SOLN
INTRAMUSCULAR | Status: AC
  Filled 2015-02-17: qty 1

## 2015-02-17 MED ORDER — ONDANSETRON HCL 4 MG/2ML IJ SOLN
INTRAMUSCULAR | Status: AC
  Filled 2015-02-17: qty 2

## 2015-02-17 MED ORDER — KCL IN DEXTROSE-NACL 20-5-0.45 MEQ/L-%-% IV SOLN
INTRAVENOUS | Status: DC
  Filled 2015-02-17: qty 1000

## 2015-02-17 MED ORDER — HYDROMORPHONE HCL 1 MG/ML IJ SOLN
1.0000 mg | INTRAMUSCULAR | Status: DC | PRN
  Filled 2015-02-17: qty 1

## 2015-02-17 MED ORDER — IBUPROFEN 600 MG PO TABS
600.0000 mg | ORAL_TABLET | Freq: Four times a day (QID) | ORAL | Status: DC | PRN
  Filled 2015-02-17: qty 1

## 2015-02-17 MED ORDER — LIDOCAINE HCL (CARDIAC) 20 MG/ML IV SOLN
INTRAVENOUS | Status: DC | PRN
  Administered 2015-02-17: 80 mg via INTRAVENOUS

## 2015-02-17 MED ORDER — ONDANSETRON HCL 4 MG PO TABS
4.0000 mg | ORAL_TABLET | Freq: Four times a day (QID) | ORAL | Status: DC | PRN
  Filled 2015-02-17: qty 1

## 2015-02-17 MED ORDER — METFORMIN HCL 500 MG PO TABS
500.0000 mg | ORAL_TABLET | Freq: Every day | ORAL | Status: DC
  Filled 2015-02-17: qty 1

## 2015-02-17 MED ORDER — PROPOFOL 10 MG/ML IV BOLUS
INTRAVENOUS | Status: DC | PRN
  Administered 2015-02-17: 200 mg via INTRAVENOUS

## 2015-02-17 MED ORDER — ONDANSETRON HCL 4 MG/2ML IJ SOLN
4.0000 mg | Freq: Four times a day (QID) | INTRAMUSCULAR | Status: DC | PRN
  Filled 2015-02-17: qty 2

## 2015-02-17 MED ORDER — DULOXETINE HCL 30 MG PO CPEP
30.0000 mg | ORAL_CAPSULE | Freq: Every morning | ORAL | Status: DC
  Filled 2015-02-17: qty 1

## 2015-02-17 MED ORDER — MIDAZOLAM HCL 5 MG/5ML IJ SOLN
INTRAMUSCULAR | Status: DC | PRN
  Administered 2015-02-17: 2 mg via INTRAVENOUS

## 2015-02-17 MED ORDER — PROMETHAZINE HCL 25 MG/ML IJ SOLN
6.2500 mg | Freq: Four times a day (QID) | INTRAMUSCULAR | Status: DC | PRN
  Administered 2015-02-17: 6.25 mg via INTRAVENOUS
  Filled 2015-02-17: qty 1

## 2015-02-17 MED ORDER — CEFAZOLIN SODIUM-DEXTROSE 2-3 GM-% IV SOLR
2.0000 g | INTRAVENOUS | Status: AC
  Administered 2015-02-17: 2 g via INTRAVENOUS
  Filled 2015-02-17: qty 50

## 2015-02-17 MED ORDER — NEOSTIGMINE METHYLSULFATE 10 MG/10ML IV SOLN
INTRAVENOUS | Status: DC | PRN
  Administered 2015-02-17: 3 mg via INTRAVENOUS

## 2015-02-17 MED ORDER — TRAMADOL HCL 50 MG PO TABS
ORAL_TABLET | ORAL | Status: AC
  Filled 2015-02-17: qty 1

## 2015-02-17 MED ORDER — ACETAMINOPHEN 10 MG/ML IV SOLN
INTRAVENOUS | Status: DC | PRN
  Administered 2015-02-17: 1000 mg via INTRAVENOUS

## 2015-02-17 MED ORDER — FENTANYL CITRATE 0.05 MG/ML IJ SOLN
INTRAMUSCULAR | Status: DC | PRN
  Administered 2015-02-17 (×4): 50 ug via INTRAVENOUS

## 2015-02-17 MED ORDER — DEXAMETHASONE SODIUM PHOSPHATE 4 MG/ML IJ SOLN
INTRAMUSCULAR | Status: DC | PRN
  Administered 2015-02-17: 10 mg via INTRAVENOUS

## 2015-02-17 MED ORDER — PROMETHAZINE HCL 25 MG/ML IJ SOLN
INTRAMUSCULAR | Status: AC
  Filled 2015-02-17: qty 1

## 2015-02-17 MED ORDER — BUPROPION HCL ER (XL) 150 MG PO TB24
150.0000 mg | ORAL_TABLET | Freq: Every morning | ORAL | Status: DC
  Filled 2015-02-17: qty 1

## 2015-02-17 MED ORDER — MIDAZOLAM HCL 2 MG/2ML IJ SOLN
INTRAMUSCULAR | Status: AC
  Filled 2015-02-17: qty 2

## 2015-02-17 MED ORDER — GLYCOPYRROLATE 0.2 MG/ML IJ SOLN
INTRAMUSCULAR | Status: DC | PRN
  Administered 2015-02-17: 0.2 mg via INTRAVENOUS

## 2015-02-17 MED ORDER — ONDANSETRON HCL 4 MG/2ML IJ SOLN
INTRAMUSCULAR | Status: DC | PRN
  Administered 2015-02-17 (×2): 4 mg via INTRAVENOUS

## 2015-02-17 MED ORDER — ROCURONIUM BROMIDE 100 MG/10ML IV SOLN
INTRAVENOUS | Status: DC | PRN
  Administered 2015-02-17: 15 mg via INTRAVENOUS
  Administered 2015-02-17: 35 mg via INTRAVENOUS

## 2015-02-17 MED ORDER — TRAMADOL HCL 50 MG PO TABS: 50.0000 mg | ORAL_TABLET | Freq: Four times a day (QID) | ORAL | Status: DC | PRN

## 2015-02-17 MED ORDER — ONDANSETRON HCL 4 MG/2ML IJ SOLN
4.0000 mg | Freq: Once | INTRAMUSCULAR | Status: AC | PRN
  Administered 2015-02-17: 4 mg via INTRAVENOUS
  Filled 2015-02-17: qty 2

## 2015-02-17 MED ORDER — EPHEDRINE SULFATE 50 MG/ML IJ SOLN
INTRAMUSCULAR | Status: DC | PRN
  Administered 2015-02-17: 10 mg via INTRAVENOUS
  Administered 2015-02-17: 15 mg via INTRAVENOUS

## 2015-02-17 MED ORDER — IOHEXOL 300 MG/ML  SOLN
INTRAMUSCULAR | Status: DC | PRN
  Administered 2015-02-17: 10 mL

## 2015-02-17 SURGICAL SUPPLY — 44 items
APPLIER CLIP ROT 10 11.4 M/L (STAPLE) ×3
BENZOIN TINCTURE PRP APPL 2/3 (GAUZE/BANDAGES/DRESSINGS) ×3 IMPLANT
CABLE HIGH FREQUENCY MONO STRZ (ELECTRODE) ×3 IMPLANT
CHLORAPREP W/TINT 26ML (MISCELLANEOUS) ×3 IMPLANT
CLIP APPLIE ROT 10 11.4 M/L (STAPLE) ×1 IMPLANT
CLOSURE WOUND 1/2 X4 (GAUZE/BANDAGES/DRESSINGS) ×1
CONT SPEC 4OZ CLIKSEAL STRL BL (MISCELLANEOUS) ×3 IMPLANT
COVER MAYO STAND STRL (DRAPES) ×3 IMPLANT
DECANTER SPIKE VIAL GLASS SM (MISCELLANEOUS) ×3 IMPLANT
DRAPE C-ARM 42X120 X-RAY (DRAPES) ×3 IMPLANT
DRAPE LAPAROSCOPIC ABDOMINAL (DRAPES) ×3 IMPLANT
DRAPE UTILITY XL STRL (DRAPES) ×3 IMPLANT
ELECT REM PT RETURN 9FT ADLT (ELECTROSURGICAL) ×3
ELECTRODE REM PT RTRN 9FT ADLT (ELECTROSURGICAL) ×1 IMPLANT
GAUZE SPONGE 2X2 8PLY STRL LF (GAUZE/BANDAGES/DRESSINGS) ×1 IMPLANT
GLOVE BIOGEL M 6.5 STRL (GLOVE) ×3 IMPLANT
GLOVE BIOGEL PI IND STRL 6.5 (GLOVE) ×1 IMPLANT
GLOVE BIOGEL PI IND STRL 7.5 (GLOVE) ×1 IMPLANT
GLOVE BIOGEL PI INDICATOR 6.5 (GLOVE) ×2
GLOVE BIOGEL PI INDICATOR 7.5 (GLOVE) ×2
GLOVE SURG ORTHO 8.0 STRL STRW (GLOVE) ×3 IMPLANT
GOWN STRL REUS W/TWL LRG LVL3 (GOWN DISPOSABLE) ×3 IMPLANT
GOWN STRL REUS W/TWL XL LVL3 (GOWN DISPOSABLE) ×9 IMPLANT
HEMOSTAT SURGICEL 4X8 (HEMOSTASIS) IMPLANT
LIQUID BAND (GAUZE/BANDAGES/DRESSINGS) ×3 IMPLANT
NS IRRIG 1000ML POUR BTL (IV SOLUTION) IMPLANT
PACK BASIN DAY SURGERY FS (CUSTOM PROCEDURE TRAY) ×3 IMPLANT
POUCH SPECIMEN RETRIEVAL 10MM (ENDOMECHANICALS) ×3 IMPLANT
SCISSORS LAP 5X35 DISP (ENDOMECHANICALS) ×3 IMPLANT
SET CHOLANGIOGRAPH MIX (MISCELLANEOUS) ×3 IMPLANT
SET IRRIG TUBING LAPAROSCOPIC (IRRIGATION / IRRIGATOR) ×3 IMPLANT
SLEEVE XCEL OPT CAN 5 100 (ENDOMECHANICALS) ×3 IMPLANT
SOLUTION ANTI FOG 6CC (MISCELLANEOUS) ×3 IMPLANT
SPONGE GAUZE 2X2 STER 10/PKG (GAUZE/BANDAGES/DRESSINGS) ×2
STRIP CLOSURE SKIN 1/2X4 (GAUZE/BANDAGES/DRESSINGS) ×2 IMPLANT
STRYKER HIGH FLOW INSUFFLATION ×3 IMPLANT
SUT MNCRL AB 4-0 PS2 18 (SUTURE) ×3 IMPLANT
TOWEL OR 17X24 6PK STRL BLUE (TOWEL DISPOSABLE) ×6 IMPLANT
TOWEL OR NON WOVEN STRL DISP B (DISPOSABLE) ×3 IMPLANT
TRAY LAPAROSCOPIC (CUSTOM PROCEDURE TRAY) ×3 IMPLANT
TROCAR BLADELESS OPT 5 100 (ENDOMECHANICALS) ×3 IMPLANT
TROCAR XCEL BLUNT TIP 100MML (ENDOMECHANICALS) ×3 IMPLANT
TROCAR XCEL NON-BLD 11X100MML (ENDOMECHANICALS) ×3 IMPLANT
TROCAR XCEL OPT SLVE 5M 100M (ENDOMECHANICALS) ×2 IMPLANT

## 2015-02-17 NOTE — Anesthesia Procedure Notes (Signed)
Procedure Name: Intubation Date/Time: 02/17/2015 10:28 AM Performed by: Wanita Chamberlain Pre-anesthesia Checklist: Patient identified, Timeout performed, Emergency Drugs available, Suction available and Patient being monitored Patient Re-evaluated:Patient Re-evaluated prior to inductionOxygen Delivery Method: Circle system utilized Preoxygenation: Pre-oxygenation with 100% oxygen Intubation Type: IV induction Ventilation: Mask ventilation without difficulty Laryngoscope Size: Mac and 3 Grade View: Grade I Tube type: Oral Tube size: 7.0 mm Number of attempts: 1 Airway Equipment and Method: Stylet Placement Confirmation: ETT inserted through vocal cords under direct vision,  breath sounds checked- equal and bilateral and positive ETCO2 Secured at: 21 cm Tube secured with: Tape Dental Injury: Teeth and Oropharynx as per pre-operative assessment

## 2015-02-17 NOTE — Transfer of Care (Signed)
Immediate Anesthesia Transfer of Care Note  Patient: Tracy Pitts  Procedure(s) Performed: Procedure(s): LAPAROSCOPIC CHOLECYSTECTOMY WITH INTRAOPERATIVE CHOLANGIOGRAM (N/A)  Patient Location: PACU  Anesthesia Type:General  Level of Consciousness: awake, alert , oriented and patient cooperative  Airway & Oxygen Therapy: Patient Spontanous Breathing and Patient connected to nasal cannula oxygen  Post-op Assessment: Report given to RN and Post -op Vital signs reviewed and stable  Post vital signs: Reviewed and stable  Last Vitals:  Filed Vitals:   02/17/15 0943  BP: 145/87  Temp: 37.1 C  Resp: 18    Complications: No apparent anesthesia complications

## 2015-02-17 NOTE — Discharge Instructions (Signed)
[facility]  LAPAROSCOPIC SURGERY:  POST-OP INSTRUCTIONS  Always review your discharge instruction sheet given to you by the facility where your surgery was performed.  A prescription for pain medication may be given to you upon discharge.  Take your pain medication as prescribed.  If narcotic pain medicine is not needed, then you may take acetaminophen (Tylenol) or ibuprofen (Advil) as needed.  Take your usually prescribed medications unless otherwise directed.  If you need a refill on your pain medication, please contact your pharmacy.  They will contact our office to request authorization. Prescriptions will not be filled after 5 P.M. or on weekends.  You should follow a light diet the first few days after arrival home, such as soup and crackers or toast.  Be sure to include plenty of fluids daily.  Most patients will experience some swelling and bruising in the area of the incisions.  Ice packs will help.  Swelling and bruising can take several days to resolve.   It is common to experience some constipation after surgery.  Increasing fluid intake and taking a stool softener (such as Colace) will usually help or prevent this problem from occurring.  A mild laxative (Milk of Magnesia or Miralax) should be taken according to package instructions if there has been no bowel movement after 48 hours.  If you have steri-strips and a gauze dressing over your incision, you may remove the gauze bandage on the second day after surgery, and you may shower at that time.  Leave your steri-strips (small skin tapes) in place directly over the incision.  These strips should remain on the skin for 7-10 days and then be removed.  You may get them wet in the shower and pat them dry.  If you have Dermabond (topical glue) over your incision, you may shower at any time following your procedure.  Leave the glue in place for 10-14 days.  When it begins to flake off around the edges, you may remove  it.  Any sutures or staples will be removed at the office during your follow-up visit.  ACTIVITIES:  You may resume regular (light) daily activities beginning the next day--such as daily self-care, walking, climbing stairs--gradually increasing activities as tolerated.  You may have sexual intercourse when it is comfortable.  Refrain from any heavy lifting or straining until approved by your doctor.  You may drive when you are no longer taking prescription pain medication, you can comfortably wear a seatbelt, and you can safely maneuver your car and apply brakes.  You should see your doctor in the office for a follow-up appointment approximately 2-3 weeks after your surgery.  Make sure that you call for this appointment within a day or two after you arrive home to insure a convenient appointment time.  WHEN TO CALL YOUR DOCTOR: 1. Fever over 101.0 2. Inability to urinate 3. Continued bleeding from incision 4. Increased pain, redness, or drainage from the incision 5. Increasing abdominal pain  The clinic staff is available to answer your questions during regular business hours.  Please dont hesitate to call and ask to speak to one of the nurses for clinical concerns.  If you have a medical emergency, go to the nearest emergency room or call 911.  A surgeon from [facility] is always on call for the hospital.  [name], MD, [facility] Office: [place] Free:  [place] [phone]  Website: [email or web address] Post Anesthesia Home Care Instructions  Activity:  Get plenty of rest for the remainder of the day. A responsible adult should stay with you for 24 hours following the procedure.  For the next 24 hours, DO NOT: -Drive a car -Paediatric nurse -Drink alcoholic beverages -Take any medication unless instructed by your physician -Make any legal decisions or sign important papers.  Meals: Start with liquid  foods such as gelatin or soup. Progress to regular foods as tolerated. Avoid greasy, spicy, heavy foods. If nausea and/or vomiting occur, drink only clear liquids until the nausea and/or vomiting subsides. Call your physician if vomiting continues.  Special Instructions/Symptoms: Your throat may feel dry or sore from the anesthesia or the breathing tube placed in your throat during surgery. If this causes discomfort, gargle with warm salt water. The discomfort should disappear within 24 hours.

## 2015-02-17 NOTE — Anesthesia Postprocedure Evaluation (Signed)
  Anesthesia Post-op Note  Patient: Tracy Pitts  Procedure(s) Performed: Procedure(s): LAPAROSCOPIC CHOLECYSTECTOMY WITH INTRAOPERATIVE CHOLANGIOGRAM (N/A)  Patient Location: PACU  Anesthesia Type:General  Level of Consciousness: awake, oriented, sedated and patient cooperative  Airway and Oxygen Therapy: Patient Spontanous Breathing  Post-op Pain: mild  Post-op Assessment: Patient's Cardiovascular Status Stable, Respiratory Function Stable, Patent Airway, NAUSEA AND VOMITING PRESENT and Pain level controlled  Post-op Vital Signs: stable  Last Vitals:  Filed Vitals:   02/17/15 1215  BP: 147/59  Pulse: 70  Temp:   Resp: 14    Complications: No apparent anesthesia complications

## 2015-02-17 NOTE — Anesthesia Preprocedure Evaluation (Signed)
Anesthesia Evaluation  Patient identified by MRN, date of birth, ID band Patient awake    Reviewed: Allergy & Precautions, NPO status , Patient's Chart, lab work & pertinent test results  Airway        Dental   Pulmonary          Cardiovascular     Neuro/Psych Depression    GI/Hepatic   Endo/Other  diabetes  Renal/GU      Musculoskeletal  (+) Arthritis -,   Abdominal   Peds  Hematology   Anesthesia Other Findings   Reproductive/Obstetrics                             Anesthesia Physical Anesthesia Plan  ASA: II  Anesthesia Plan: General   Post-op Pain Management:    Induction: Intravenous  Airway Management Planned: Oral ETT  Additional Equipment:   Intra-op Plan:   Post-operative Plan: Extubation in OR  Informed Consent: I have reviewed the patients History and Physical, chart, labs and discussed the procedure including the risks, benefits and alternatives for the proposed anesthesia with the patient or authorized representative who has indicated his/her understanding and acceptance.     Plan Discussed with: CRNA, Anesthesiologist and Surgeon  Anesthesia Plan Comments:         Anesthesia Quick Evaluation

## 2015-02-17 NOTE — Op Note (Signed)
Procedure Note  Pre-operative Diagnosis:  Symptomatic cholelithiasis  Post-operative Diagnosis:  same  Surgeon:  [name], MD, FACS  Assistant:  none   Procedure:  Laparoscopic cholecystectomy with intra-operative cholangiography  Anesthesia:  General  Estimated Blood Loss:  minimal  Drains: none         Specimen: Gallbladder to pathology  Indications:  The patient is a 54 year old female who presents for evaluation of gall stones. Patient is referred by Dr. Unice Cobble for evaluation of symptomatic cholelithiasis. Patient has had intermittent upper abdominal pain. Over the past few months this is becoming more frequent and more intense. Patient denies any fever or chills. She has had nausea but no emesis. By report she had mild elevation of her liver function test. Patient underwent abdominal ultrasound which demonstrates multiple gallstones and gallbladder sludge. There are no acute findings. There was no biliary dilatation. Patient has had previous cesarean section. No other abdominal surgery.   Procedure Details:  The patient was seen in the pre-op holding area. The risks, benefits, complications, treatment options, and expected outcomes were previously discussed with the patient. The patient agreed with the proposed plan and has signed the informed consent form.  The patient was brought to the Operating Room, identified as Tracy Pitts and the procedure verified as laparoscopic cholecystectomy with intraoperative cholangiography. A "time out" was completed and the above information confirmed.  The patient was placed in the supine position. Following induction of general anesthesia, the abdomen was prepped and draped in the usual aseptic fashion.  An incision was made in the skin near the umbilicus. The midline fascia was incised and the peritoneal cavity was entered and a Hasson canula was introduced under direct vision.  The Hasson canula was secured with a  0-Vicryl pursestring suture. Pneumoperitoneum was established with carbon dioxide. Additional trocars were introduced under direct vision along the right costal margin in the midline, mid-clavicular line, and anterior axillary line.   The gallbladder was identified and the fundus grasped and retracted cephalad. Adhesions were taken down bluntly and the electrocautery was utilized as needed, taking care not to injure any adjacent structures. The infundibulum was grasped and retracted laterally, exposing the peritoneum overlying the triangle of Calot. The peritoneum was incised and structures exposed with blunt dissection. The cystic duct was clearly identified, bluntly dissected circumferentially, and clipped at the neck of the gallbladder.  An incision was made in the cystic duct and the cholangiogram catheter introduced. The catheter was secured using an ligaclip.  Real-time cholangiography was performed using C-arm fluoroscopy.  There was rapid filling of a normal caliber common bile duct.  There was reflux of contrast into the left and right hepatic ductal systems.  There was free flow distally into the duodenum without filling defect or obstruction.  The catheter was removed from the peritoneal cavity.  The cystic duct was then ligated with surgical clips and divided. The cystic artery was identified, dissected circumferentially, ligated with ligaclips, and divided.  The gallbladder was dissected away from the liver bed using the electrocautery for hemostasis. The gallbladder was completely removed from the liver and placed into an endocatch bag. The gallbladder was removed in the endocatch bag through the umbilical port site and submitted to pathology for review.  The right upper quadrant was irrigated and the gallbladder bed was inspected. Hemostasis was achieved with the electrocautery.  Pneumoperitoneum was released after viewing removal of the trocars with good hemostasis noted. The umbilical  wound was irrigated  and the fascia was then closed with the pursestring suture.  Local anesthetic was infiltrated at all port sites. The skin incisions were closed with 4-0 Monocril subcuticular sutures and steri-strips and dressings were applied.  Instrument, sponge, and needle counts were correct at the conclusion of the case.  The patient was awakened from anesthesia and brought to the recovery room in stable condition.  The patient tolerated the procedure well.   [name], MD, [facility] Office: [phone]

## 2015-02-17 NOTE — Interval H&P Note (Signed)
History and Physical Interval Note:  02/17/2015 10:13 AM  Tracy Pitts  has presented today for surgery, with the diagnosis of Cholelithiasis.  The various methods of treatment have been discussed with the patient and family. After consideration of risks, benefits and other options for treatment, the patient has consented to    Procedure(s): LAPAROSCOPIC CHOLECYSTECTOMY WITH INTRAOPERATIVE CHOLANGIOGRAM (N/A) as a surgical intervention .    The patient's history has been reviewed, patient examined, no change in status, stable for surgery.  I have reviewed the patient's chart and labs.  Questions were answered to the patient's satisfaction.    [name], MD, [facility] Office: [place]

## 2015-02-18 NOTE — Progress Notes (Signed)
Unable to document waste from 02/17/2015 in pyxis.  Dilaudid 0.5mg  iv wasted in medical device. Witnessed by [employer], Therapist, sports.

## 2015-02-19 ENCOUNTER — Encounter (HOSPITAL_BASED_OUTPATIENT_CLINIC_OR_DEPARTMENT_OTHER): Payer: Self-pay | Admitting: Surgery

## 2015-03-24 ENCOUNTER — Encounter: Payer: Self-pay | Admitting: Internal Medicine

## 2015-04-23 ENCOUNTER — Telehealth: Payer: Self-pay | Admitting: Geriatric Medicine

## 2015-04-23 NOTE — Telephone Encounter (Signed)
Left message asking patient to call back to let me know if she has had a mammogram recently.  

## 2015-04-23 NOTE — Telephone Encounter (Signed)
Patient states that she had a mammogram at dr Paula Compton (obgyn) office  in March. She states that it is Microbiologist.

## 2015-12-08 MED FILL — BUPROPION HCL XL 300 MG TAB: 300 | 90 days supply | Qty: 90 | Fill #1

## 2015-12-08 MED FILL — DULoxetine HCL 20 MG CPEP: 20 | 30 days supply | Qty: 30 | Fill #5

## 2016-01-08 MED FILL — DULoxetine HCL 20 MG CPEP: 20 | 30 days supply | Qty: 30 | Fill #6

## 2016-02-09 MED FILL — DULoxetine HCL 20 MG CPEP: 20 | 30 days supply | Qty: 30 | Fill #7

## 2016-02-26 DIAGNOSIS — Z1231 Encounter for screening mammogram for malignant neoplasm of breast: Secondary | ICD-10-CM | POA: Diagnosis not present

## 2016-02-26 DIAGNOSIS — Z6836 Body mass index (BMI) 36.0-36.9, adult: Secondary | ICD-10-CM | POA: Diagnosis not present

## 2016-02-26 DIAGNOSIS — N952 Postmenopausal atrophic vaginitis: Secondary | ICD-10-CM | POA: Diagnosis not present

## 2016-02-26 DIAGNOSIS — Z01419 Encounter for gynecological examination (general) (routine) without abnormal findings: Secondary | ICD-10-CM | POA: Diagnosis not present

## 2016-02-26 DIAGNOSIS — Z130 Encounter for screening for diseases of the blood and blood-forming organs and certain disorders involving the immune mechanism: Secondary | ICD-10-CM | POA: Diagnosis not present

## 2016-02-26 MED FILL — ESTRACE 0.01% CREAM: 0.1 | 90 days supply | Qty: 43 | Fill #0

## 2016-03-02 MED FILL — BUPROPION HCL XL 300 MG TAB: 300 | 90 days supply | Qty: 90 | Fill #2

## 2016-03-03 MED FILL — DULoxetine HCL 20 MG CPEP: 20 | 30 days supply | Qty: 30 | Fill #8

## 2016-04-05 MED FILL — DULoxetine HCL 20 MG CPEP: 20 | 30 days supply | Qty: 30 | Fill #9

## 2016-04-12 MED FILL — metFORMIN HCL 500 MG TABS: 500 | 90 days supply | Qty: 180 | Fill #1

## 2016-04-29 MED FILL — DULoxetine HCL 20 MG CPEP: 20 | 30 days supply | Qty: 30 | Fill #0

## 2016-05-31 MED FILL — DULoxetine HCL 20 MG CPEP: 20 | 90 days supply | Qty: 90 | Fill #0

## 2016-06-07 MED FILL — BUPROPION HCL XL 300 MG TAB: 300 | 90 days supply | Qty: 90 | Fill #0

## 2016-06-10 IMAGING — US US ABDOMEN LIMITED
1 series · 14 of 25 positions shown · non-contrast
Comparison: None.

CLINICAL DATA: Right upper quadrant pain for the past 2-3 months
associated with nausea

EXAM:
US ABDOMEN LIMITED - RIGHT UPPER QUADRANT

[Series 1: us abdomen limited · 0.20mm/px · 14 of 51 slices shown]
[im 1/51]
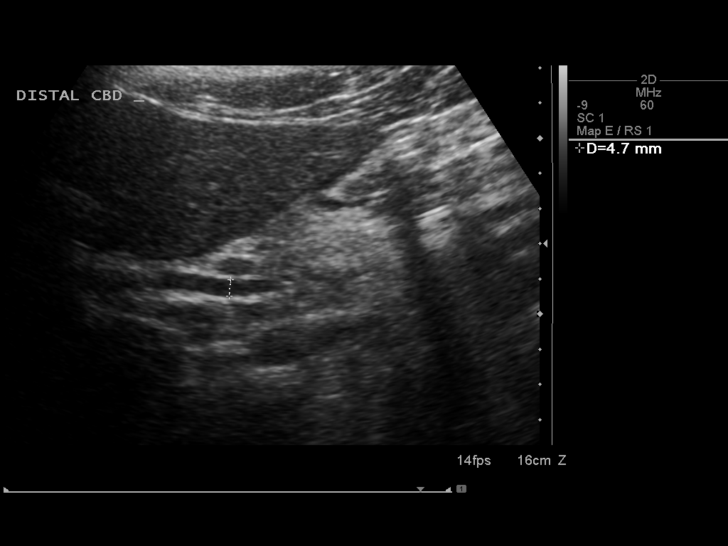
[im 5/51]
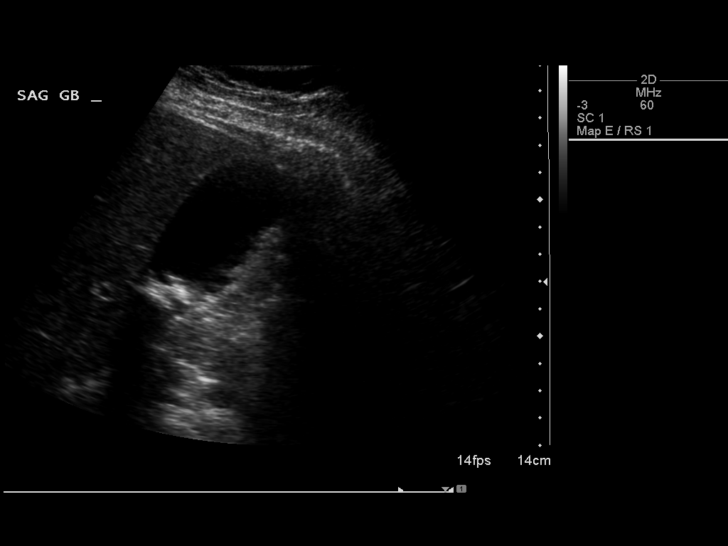
[im 9/51]
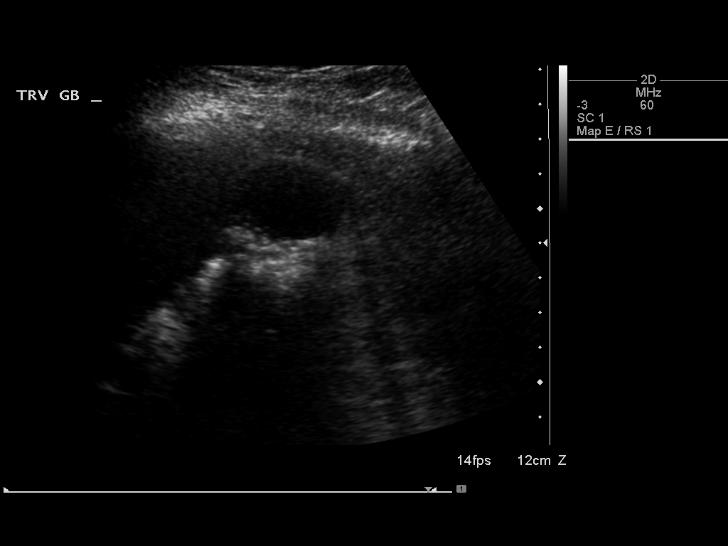
[im 13/51]
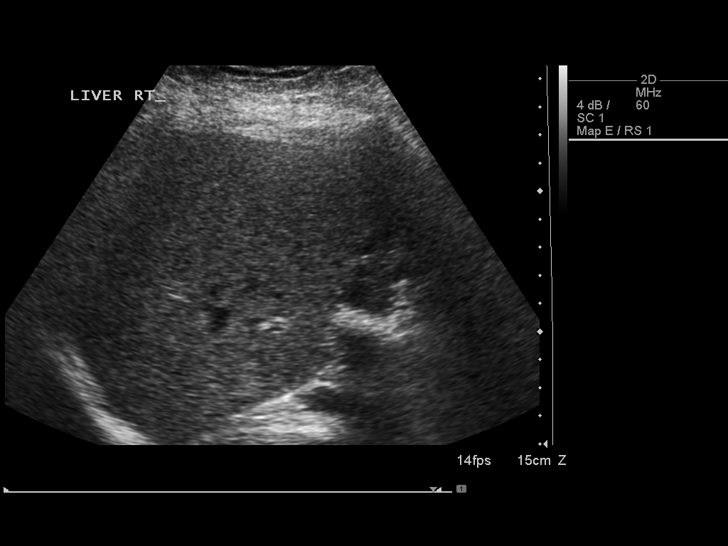
[im 17/51]
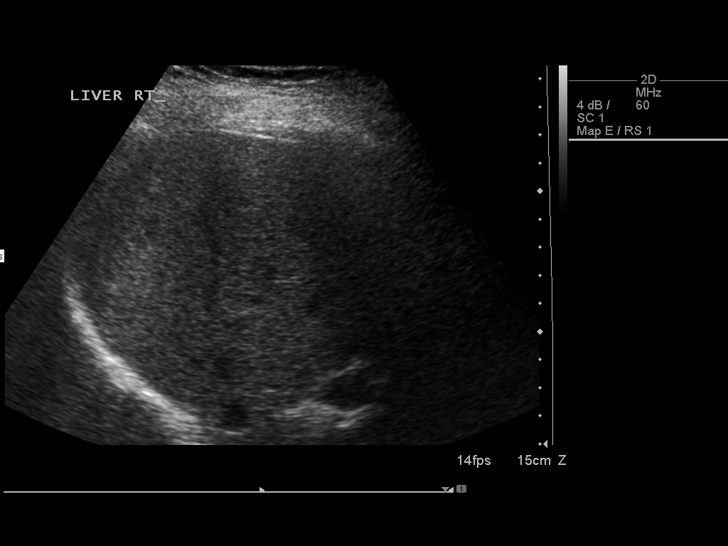
[im 19/51]
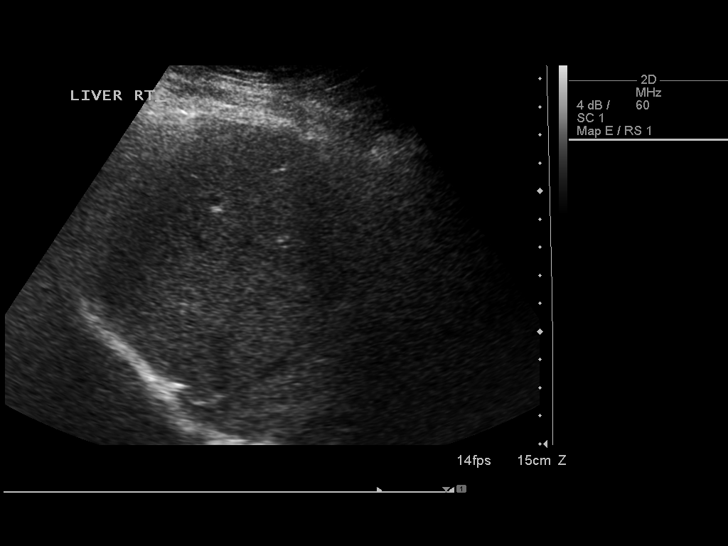
[im 23/51]
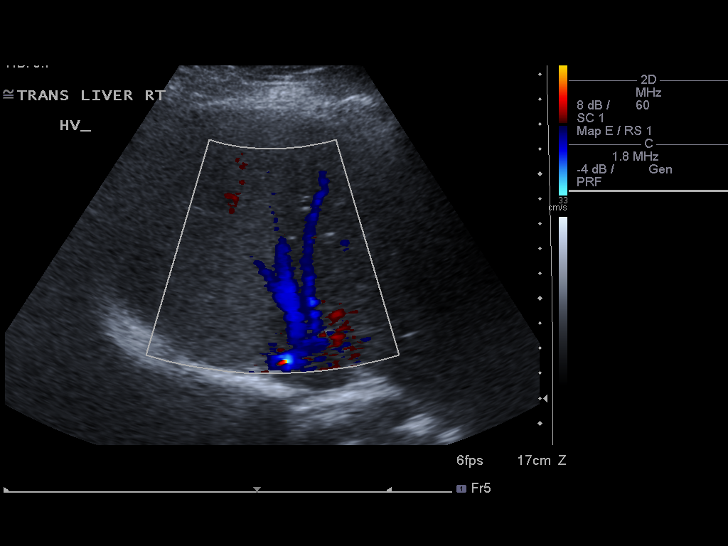
[im 28/51]
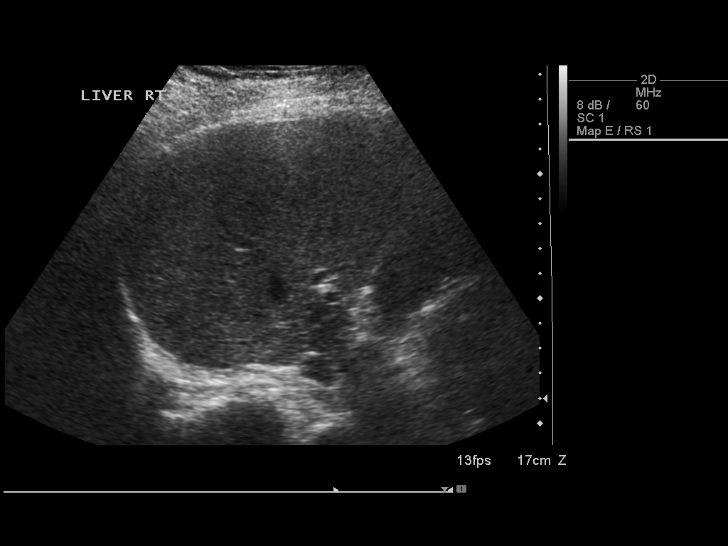
[im 32/51]
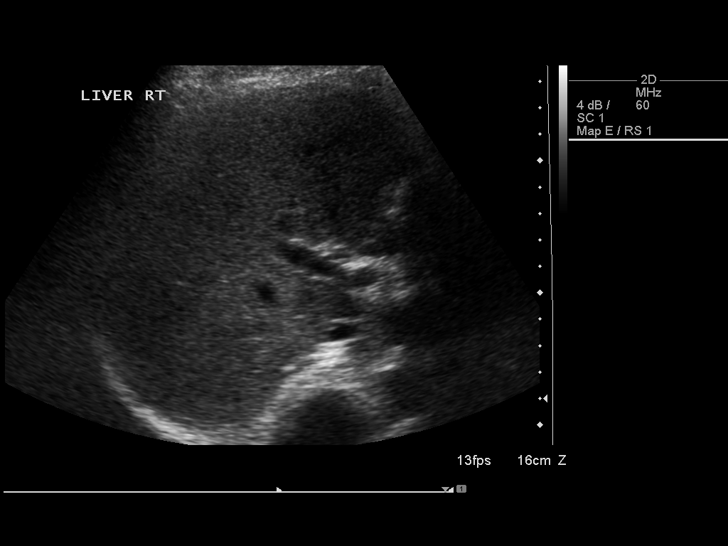
[im 34/51]
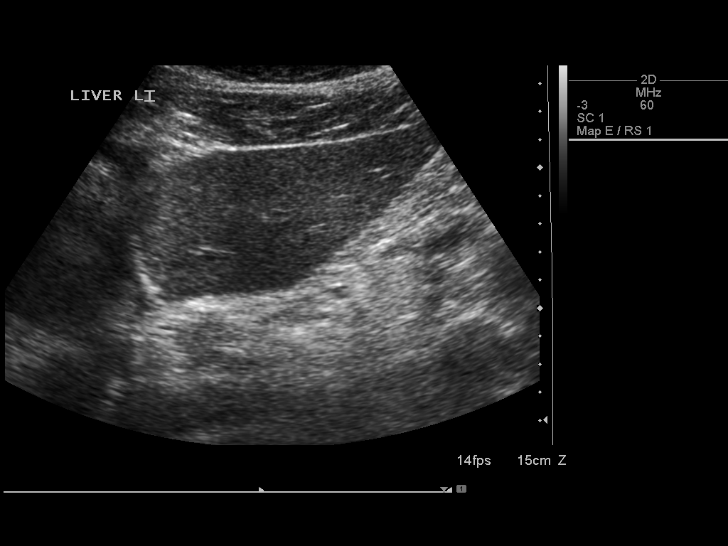
[im 38/51]
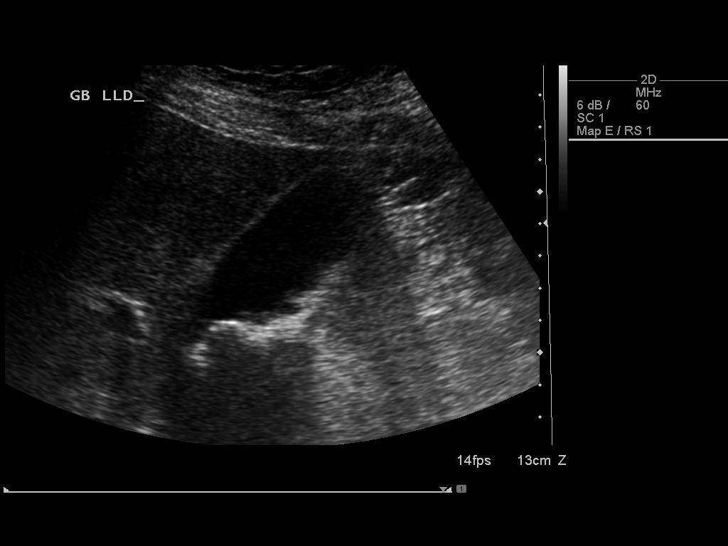
[im 42/51]
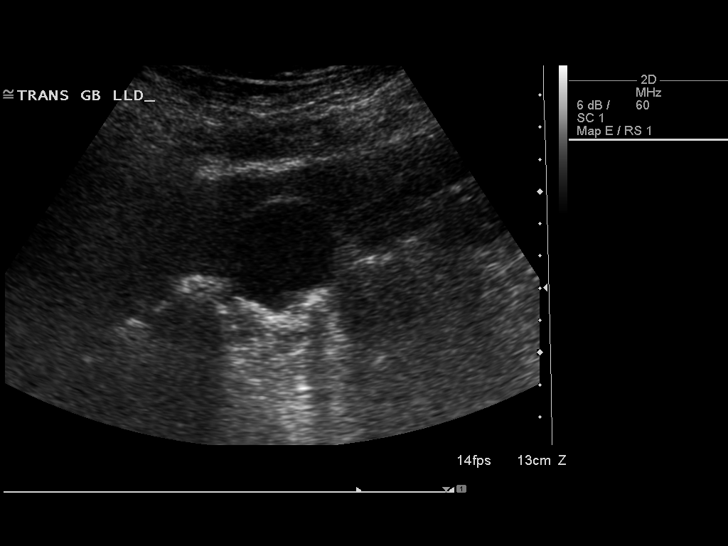
[im 46/51]
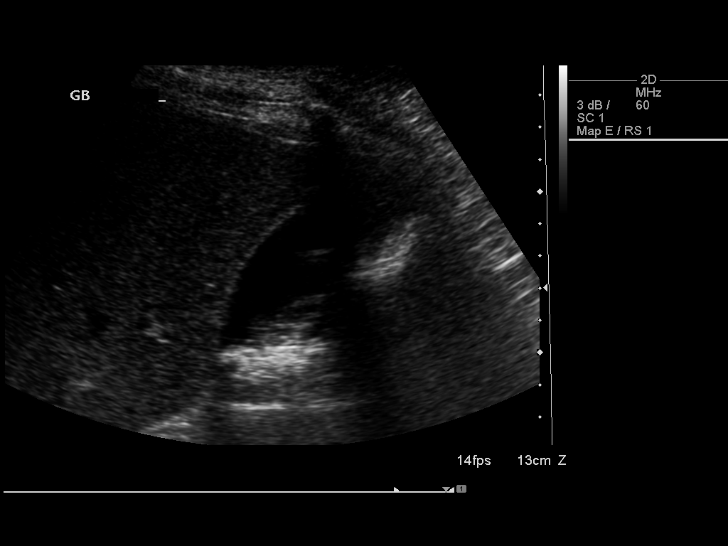
[im 51/51]
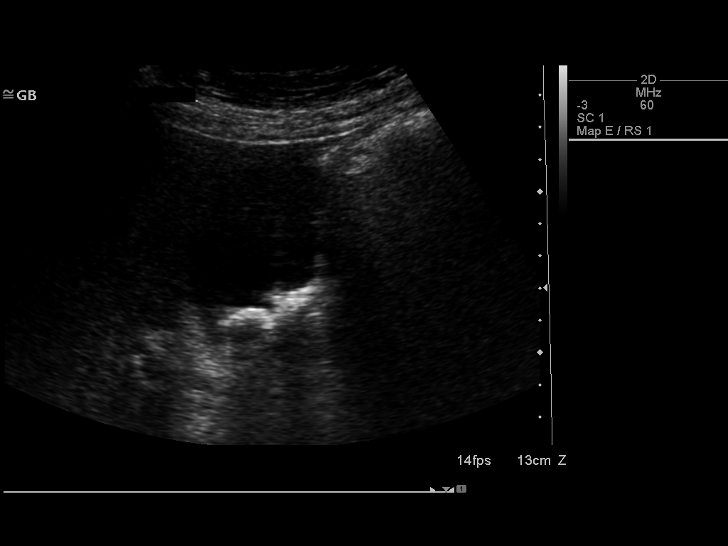

[14 of 25 positions shown; findings below may reference images not displayed]

FINDINGS: Gallbladder:

The gallbladder is adequately distended. There are multiple mobile
shadowing stones. Sludge is present as well. There is no gallbladder
wall thickening or pericholecystic fluid. There is no positive
sonographic Murphy's sign.

Common bile duct:

Diameter: 4.7 mm

Liver:

The liver exhibits no focal mass or ductal dilation. The echotexture
is normal.
IMPRESSION: Cholelithiasis without evidence of acute cholecystitis.

## 2016-09-06 MED FILL — metFORMIN HCL 500 MG TABS: 500 | 90 days supply | Qty: 180 | Fill #0

## 2016-09-06 MED FILL — BUPROPION HCL XL 300 MG TAB: 300 | 90 days supply | Qty: 90 | Fill #1

## 2016-09-06 MED FILL — DULoxetine HCL 20 MG CPEP: 20 | 90 days supply | Qty: 90 | Fill #1

## 2016-11-30 MED FILL — BUPROPION HCL XL 300 MG TAB: 300 | 90 days supply | Qty: 90 | Fill #2

## 2016-11-30 MED FILL — DULoxetine HCL 20 MG CPEP: 20 | 90 days supply | Qty: 90 | Fill #2

## 2017-01-04 DIAGNOSIS — H52223 Regular astigmatism, bilateral: Secondary | ICD-10-CM | POA: Diagnosis not present

## 2017-01-04 DIAGNOSIS — L309 Dermatitis, unspecified: Secondary | ICD-10-CM | POA: Diagnosis not present

## 2017-01-04 DIAGNOSIS — L820 Inflamed seborrheic keratosis: Secondary | ICD-10-CM | POA: Diagnosis not present

## 2017-01-04 DIAGNOSIS — H524 Presbyopia: Secondary | ICD-10-CM | POA: Diagnosis not present

## 2017-01-04 DIAGNOSIS — L739 Follicular disorder, unspecified: Secondary | ICD-10-CM | POA: Diagnosis not present

## 2017-01-04 DIAGNOSIS — D235 Other benign neoplasm of skin of trunk: Secondary | ICD-10-CM | POA: Diagnosis not present

## 2017-01-04 DIAGNOSIS — H5213 Myopia, bilateral: Secondary | ICD-10-CM | POA: Diagnosis not present

## 2017-01-04 DIAGNOSIS — L814 Other melanin hyperpigmentation: Secondary | ICD-10-CM | POA: Diagnosis not present

## 2017-01-04 MED FILL — TRIAMCINOLONE 0.1% CREAM: 0.1 | 30 days supply | Qty: 80 | Fill #0

## 2017-02-23 MED FILL — DULoxetine HCL 20 MG CPEP: 20 | 90 days supply | Qty: 90 | Fill #3

## 2017-02-23 MED FILL — metFORMIN HCL 500 MG TABS: 500 | 90 days supply | Qty: 180 | Fill #1

## 2017-02-28 MED FILL — BUPROPION HCL XL 300 MG TAB: 300 | 90 days supply | Qty: 90 | Fill #3

## 2017-03-21 DIAGNOSIS — Z1231 Encounter for screening mammogram for malignant neoplasm of breast: Secondary | ICD-10-CM | POA: Diagnosis not present

## 2017-03-21 DIAGNOSIS — Z130 Encounter for screening for diseases of the blood and blood-forming organs and certain disorders involving the immune mechanism: Secondary | ICD-10-CM | POA: Diagnosis not present

## 2017-03-21 DIAGNOSIS — Z01419 Encounter for gynecological examination (general) (routine) without abnormal findings: Secondary | ICD-10-CM | POA: Diagnosis not present

## 2017-03-21 DIAGNOSIS — R7303 Prediabetes: Secondary | ICD-10-CM | POA: Diagnosis not present

## 2017-03-21 DIAGNOSIS — Z1389 Encounter for screening for other disorder: Secondary | ICD-10-CM | POA: Diagnosis not present

## 2017-03-21 DIAGNOSIS — Z6838 Body mass index (BMI) 38.0-38.9, adult: Secondary | ICD-10-CM | POA: Diagnosis not present

## 2017-03-21 DIAGNOSIS — Z0000 Encounter for general adult medical examination without abnormal findings: Secondary | ICD-10-CM | POA: Diagnosis not present

## 2017-03-21 LAB — HEPATIC FUNCTION PANEL
ALK PHOS: 83 U/L (ref 25–125)
ALT: 29 U/L (ref 7–35)
AST: 17 U/L (ref 13–35)
BILIRUBIN, TOTAL: 0.3 mg/dL

## 2017-03-21 LAB — BASIC METABOLIC PANEL
BUN: 16 mg/dL (ref 4–21)
Creatinine: 0.8 mg/dL (ref 0.5–1.1)
GLUCOSE: 90 mg/dL
Potassium: 4.6 mmol/L (ref 3.4–5.3)
SODIUM: 140 mmol/L (ref 137–147)

## 2017-03-21 LAB — VITAMIN D 25 HYDROXY (VIT D DEFICIENCY, FRACTURES): Vit D, 25-Hydroxy: 22

## 2017-03-21 LAB — LIPID PANEL
Cholesterol: 249 mg/dL — AB (ref 0–200)
HDL: 72 mg/dL — AB (ref 35–70)
LDL CALC: 158 mg/dL
LDL/HDL RATIO: 2.2
Triglycerides: 95 mg/dL (ref 40–160)

## 2017-03-21 LAB — CBC AND DIFFERENTIAL
HCT: 44 % (ref 36–46)
HEMOGLOBIN: 15 g/dL (ref 12.0–16.0)
Neutrophils Absolute: 4 /uL
PLATELETS: 288 10*3/uL (ref 150–399)
WBC: 6.1 10*3/mL

## 2017-03-21 LAB — HM MAMMOGRAPHY: HM Mammogram: NORMAL (ref 0–4)

## 2017-03-21 LAB — HEMOGLOBIN A1C: HEMOGLOBIN A1C: 5.8

## 2017-03-21 LAB — TSH: TSH: 3.6 u[IU]/mL (ref 0.41–5.90)

## 2017-03-21 LAB — HM PAP SMEAR

## 2017-03-24 ENCOUNTER — Encounter: Payer: Self-pay | Admitting: General Practice

## 2017-04-05 ENCOUNTER — Encounter: Payer: Self-pay | Admitting: General Practice

## 2017-05-20 ENCOUNTER — Encounter: Payer: Self-pay | Admitting: Family Medicine

## 2017-05-20 ENCOUNTER — Ambulatory Visit (INDEPENDENT_AMBULATORY_CARE_PROVIDER_SITE_OTHER): Payer: 59 | Admitting: Family Medicine

## 2017-05-20 VITALS — BP 122/82 | HR 85 | Temp 98.6°F | Resp 16 | Ht 66.0 in | Wt 231.5 lb

## 2017-05-20 DIAGNOSIS — F419 Anxiety disorder, unspecified: Secondary | ICD-10-CM | POA: Diagnosis not present

## 2017-05-20 DIAGNOSIS — E785 Hyperlipidemia, unspecified: Secondary | ICD-10-CM | POA: Diagnosis not present

## 2017-05-20 DIAGNOSIS — R7303 Prediabetes: Secondary | ICD-10-CM | POA: Diagnosis not present

## 2017-05-20 DIAGNOSIS — F329 Major depressive disorder, single episode, unspecified: Secondary | ICD-10-CM | POA: Diagnosis not present

## 2017-05-20 MED ORDER — BUPROPION HCL ER (XL) 150 MG PO TB24: 150.0000 mg | ORAL_TABLET | Freq: Every day | ORAL | 3 refills | Status: DC

## 2017-05-20 MED ORDER — SERTRALINE HCL 50 MG PO TABS: 50.0000 mg | ORAL_TABLET | Freq: Every day | ORAL | 3 refills | Status: DC

## 2017-05-20 MED FILL — SERTRALINE HCL 50 MG TABLET: 50 | 30 days supply | Qty: 30 | Fill #0

## 2017-05-20 MED FILL — BUPROPION HCL XL 150 MG TAB: 150 | 30 days supply | Qty: 30 | Fill #0

## 2017-05-20 NOTE — Patient Instructions (Signed)
Follow up in 1 month to recheck mood Decrease the Wellbutrin to 150mg  (new prescription sent) STOP the Cymbalta Start the Sertraline once daily Consider adding Red Yeast Rice to your diet and exercise to improve your cholesterol Call with any questions or concerns Welcome!  We're glad to have you!!!

## 2017-05-20 NOTE — Progress Notes (Signed)
   Subjective:    Patient ID: Tracy Pitts, female    DOB: 1961/04/25, 56 y.o.   MRN: [id number]  HPI New to establish.  Previous MD- [place]- pt is currently on Metformin 500mg  daily.  Highest A1C 6.1- in April was down to 5.8.  Pt is carb conscious and walking regularly (at least 3x/week).  Pt has a hx of gestational diabetes.  Hyperlipidemia- last labs show LDL of 158.  Pt has never been on medication.  Total was 249.  HDL is 72.  Anxiety- chronic problem.  Currently on Wellbutrin 300mg  and Cymbalta 20mg  daily.  Pt doesn't like the way that the medications make her feel, 'my head gets all floaty-like'.  Pt reports she is having increased anxiety, particularly at night.   Review of Systems For ROS see HPI     Objective:   Physical Exam  Constitutional: She is oriented to person, place, and time. She appears well-developed and well-nourished. No distress.  HENT:  Head: Normocephalic and atraumatic.  Eyes: Conjunctivae and EOM are normal. Pupils are equal, round, and reactive to light.  Neck: Normal range of motion. Neck supple. No thyromegaly present.  Cardiovascular: Normal rate, regular rhythm, normal heart sounds and intact distal pulses.   No murmur heard. Pulmonary/Chest: Effort normal and breath sounds normal. No respiratory distress.  Abdominal: Soft. She exhibits no distension. There is no tenderness.  Musculoskeletal: She exhibits no edema.  Lymphadenopathy:    She has no cervical adenopathy.  Neurological: She is alert and oriented to person, place, and time.  Skin: Skin is warm and dry.  Psychiatric: She has a normal mood and affect. Her behavior is normal.          Assessment & Plan:

## 2017-05-20 NOTE — Assessment & Plan Note (Signed)
New to provider, ongoing for pt.  Tolerating Metformin w/o difficulty.  Most recent A1C 5.8.  No changes at this time.  Will follow.

## 2017-05-20 NOTE — Assessment & Plan Note (Signed)
New to provider, ongoing for pt.  She does not like the way her medication makes her feel and her anxiety is not well controlled.  Will decrease the Wellbutrin as this could be worsening her anxiety and switch the Cymbalta to Zoloft.  Pt expressed understanding and is in agreement w/ plan.

## 2017-05-20 NOTE — Assessment & Plan Note (Signed)
New to provider, ongoing for pt.  Stressed need for healthy diet and regular exercise.  Reviewed recent labs.  Pt to add OTC Red Yeast Rice.  Pt expressed understanding and is in agreement w/ plan.

## 2017-05-20 NOTE — Progress Notes (Signed)
Pre visit review using our clinic review tool, if applicable. No additional management support is needed unless otherwise documented below in the visit note. 

## 2017-06-16 ENCOUNTER — Ambulatory Visit (INDEPENDENT_AMBULATORY_CARE_PROVIDER_SITE_OTHER): Payer: 59 | Admitting: Family Medicine

## 2017-06-16 ENCOUNTER — Encounter: Payer: Self-pay | Admitting: Family Medicine

## 2017-06-16 VITALS — BP 118/80 | HR 73 | Temp 98.6°F | Resp 16 | Ht 66.0 in | Wt 232.5 lb

## 2017-06-16 DIAGNOSIS — F329 Major depressive disorder, single episode, unspecified: Secondary | ICD-10-CM | POA: Diagnosis not present

## 2017-06-16 DIAGNOSIS — F419 Anxiety disorder, unspecified: Secondary | ICD-10-CM | POA: Diagnosis not present

## 2017-06-16 MED ORDER — SERTRALINE HCL 50 MG PO TABS: 50.0000 mg | ORAL_TABLET | Freq: Every day | ORAL | 1 refills | Status: DC

## 2017-06-16 MED FILL — SERTRALINE HCL 50 MG TABLET: 50 | 90 days supply | Qty: 90 | Fill #0

## 2017-06-16 NOTE — Progress Notes (Signed)
Pre visit review using our clinic review tool, if applicable. No additional management support is needed unless otherwise documented below in the visit note. 

## 2017-06-16 NOTE — Progress Notes (Signed)
   Subjective:    Patient ID: Eula Listen, female    DOB: 09/25/1961, 56 y.o.   MRN: [id number]  HPI Anxiety- chronic problem.  At last visit we switched Cymbalta for Zoloft and decreased Wellbutrin to 150mg  daily.  Pt reports she is feeling 'a lot better'.  Anxiety is less, word finding is better.  Pt notes big difference since stopping Cymbalta.  Sleeping better- 'definitely'.   Review of Systems For ROS see HPI     Objective:   Physical Exam  Constitutional: She is oriented to person, place, and time. She appears well-developed and well-nourished. No distress.  Neurological: She is alert and oriented to person, place, and time.  Skin: Skin is warm and dry.  Psychiatric: She has a normal mood and affect. Her behavior is normal. Thought content normal.  Vitals reviewed.         Assessment & Plan:

## 2017-06-16 NOTE — Patient Instructions (Signed)
Follow up in October to recheck cholesterol and sugar Continue the Sertraline and Wellbutrin- no changes at this time If the mood is good in October, we can consider stopping the Wellbutrin Call with any questions or concerns Keep up the good work!!!  You look great!!! Enjoy the rest of your summer!

## 2017-06-16 NOTE — Assessment & Plan Note (Signed)
Much improved since stopping Cymbalta and starting Zoloft.  Feels decreasing the Wellbutrin has also helped.  Sleeping better, less anxious, improved concentration.  No med changes at this time but pt is considering stopping the Wellbutrin altogether in the future.  Will follow.

## 2017-08-18 MED FILL — buPROPion HCL ER (XL) 150 M: 150 | 30 days supply | Qty: 30 | Fill #1

## 2017-09-08 ENCOUNTER — Ambulatory Visit: Payer: 59 | Admitting: Family Medicine

## 2017-09-15 MED FILL — BUPROPION HCL XL 150 MG TAB: 150 | 30 days supply | Qty: 30 | Fill #2

## 2017-09-15 MED FILL — SERTRALINE HCL 50 MG TABLET: 50 | 90 days supply | Qty: 90 | Fill #1

## 2017-09-15 MED FILL — metFORMIN HCL 500 MG TABS: 500 | 90 days supply | Qty: 180 | Fill #0

## 2017-09-19 ENCOUNTER — Encounter: Payer: Self-pay | Admitting: Family Medicine

## 2017-09-19 ENCOUNTER — Ambulatory Visit (INDEPENDENT_AMBULATORY_CARE_PROVIDER_SITE_OTHER): Payer: 59 | Admitting: Family Medicine

## 2017-09-19 VITALS — BP 118/82 | HR 82 | Temp 99.1°F | Resp 14 | Wt 234.5 lb

## 2017-09-19 DIAGNOSIS — R7303 Prediabetes: Secondary | ICD-10-CM

## 2017-09-19 DIAGNOSIS — E785 Hyperlipidemia, unspecified: Secondary | ICD-10-CM

## 2017-09-19 LAB — HEPATIC FUNCTION PANEL
ALBUMIN: 4.3 g/dL (ref 3.5–5.2)
ALT: 17 U/L (ref 0–35)
AST: 13 U/L (ref 0–37)
Alkaline Phosphatase: 65 U/L (ref 39–117)
Bilirubin, Direct: 0.1 mg/dL (ref 0.0–0.3)
Total Bilirubin: 0.4 mg/dL (ref 0.2–1.2)
Total Protein: 6.5 g/dL (ref 6.0–8.3)

## 2017-09-19 LAB — CBC WITH DIFFERENTIAL/PLATELET
BASOS ABS: 0.1 10*3/uL (ref 0.0–0.1)
Basophils Relative: 1.1 % (ref 0.0–3.0)
EOS ABS: 0.1 10*3/uL (ref 0.0–0.7)
Eosinophils Relative: 2 % (ref 0.0–5.0)
HCT: 44.3 % (ref 36.0–46.0)
Hemoglobin: 14.6 g/dL (ref 12.0–15.0)
LYMPHS ABS: 1.3 10*3/uL (ref 0.7–4.0)
LYMPHS PCT: 26.8 % (ref 12.0–46.0)
MCHC: 33 g/dL (ref 30.0–36.0)
MCV: 95.4 fl (ref 78.0–100.0)
Monocytes Absolute: 0.4 10*3/uL (ref 0.1–1.0)
Monocytes Relative: 9.1 % (ref 3.0–12.0)
NEUTROS ABS: 2.9 10*3/uL (ref 1.4–7.7)
Neutrophils Relative %: 61 % (ref 43.0–77.0)
PLATELETS: 235 10*3/uL (ref 150.0–400.0)
RBC: 4.64 Mil/uL (ref 3.87–5.11)
RDW: 13.2 % (ref 11.5–15.5)
WBC: 4.8 10*3/uL (ref 4.0–10.5)

## 2017-09-19 LAB — BASIC METABOLIC PANEL
BUN: 16 mg/dL (ref 6–23)
CO2: 27 mEq/L (ref 19–32)
Calcium: 9.4 mg/dL (ref 8.4–10.5)
Chloride: 106 mEq/L (ref 96–112)
Creatinine, Ser: 0.72 mg/dL (ref 0.40–1.20)
GFR: 89.17 mL/min (ref >60.00)
GLUCOSE: 94 mg/dL (ref 70–99)
POTASSIUM: 4.5 meq/L (ref 3.5–5.1)
Sodium: 141 mEq/L (ref 135–145)

## 2017-09-19 LAB — LIPID PANEL
CHOL/HDL RATIO: 4
Cholesterol: 223 mg/dL — ABNORMAL HIGH (ref 0–200)
HDL: 61.8 mg/dL (ref >39.00)
LDL CALC: 142 mg/dL — AB (ref 0–99)
NonHDL: 160.75
TRIGLYCERIDES: 94 mg/dL (ref 0.0–149.0)
VLDL: 18.8 mg/dL (ref 0.0–40.0)

## 2017-09-19 LAB — TSH: TSH: 2.27 u[IU]/mL (ref 0.35–4.50)

## 2017-09-19 LAB — HEMOGLOBIN A1C: Hgb A1c MFr Bld: 5.8 % (ref 4.6–6.5)

## 2017-09-19 NOTE — Patient Instructions (Signed)
Schedule your complete physical in 6 months We'll notify you of your lab results and make any changes if needed Continue to work on healthy diet and regular exercise- you can do it!  Just don't get frustrated!!!! Call with any questions or concerns Happy Anniversary!!!

## 2017-09-19 NOTE — Progress Notes (Signed)
   Subjective:    Patient ID: Tracy Pitts, female    DOB: January 19, 1961, 56 y.o.   MRN: [id number]  HPI Hyperlipidemia- ongoing issue.  Last LDL was 158 and pt opted for 6 months of diet and exercise along w/ Red Yeast Rice to try and improve numbers.  Pt has not lost any weight since then.  Pt reports she has been working on her diet and walking and doing elliptical.  Denies CP, SOB, HAs, visual changes, abd pain, N/V.  Pre-DM- ongoing issue.  On Metformin per GYN.  Did have a symptomatic low recently but was able to recognize the signs and eat to improve sugar.   Review of Systems For ROS see HPI     Objective:   Physical Exam  Constitutional: She is oriented to person, place, and time. She appears well-developed and well-nourished. No distress.  HENT:  Head: Normocephalic and atraumatic.  Eyes: Pupils are equal, round, and reactive to light. Conjunctivae and EOM are normal.  Neck: Normal range of motion. Neck supple. No thyromegaly present.  Cardiovascular: Normal rate, regular rhythm, normal heart sounds and intact distal pulses.   No murmur heard. Pulmonary/Chest: Effort normal and breath sounds normal. No respiratory distress.  Abdominal: Soft. She exhibits no distension. There is no tenderness.  Musculoskeletal: She exhibits no edema.  Lymphadenopathy:    She has no cervical adenopathy.  Neurological: She is alert and oriented to person, place, and time.  Skin: Skin is warm and dry.  Psychiatric: She has a normal mood and affect. Her behavior is normal.  Vitals reviewed.         Assessment & Plan:

## 2017-09-19 NOTE — Assessment & Plan Note (Signed)
Chronic problem.  Now on Red Yeast Rice while she works on [employer] and regular exercise.  Check labs.  Adjust tx plan prn.  Pt expressed understanding and is in agreement w/ plan.

## 2017-09-19 NOTE — Assessment & Plan Note (Signed)
Chronic problem.  GYN is providing her Metformin and pt has had a recent symptomatic low.  She is able to recognize these and take steps to correct them.  Check labs.  Adjust tx plan prn.

## 2017-10-24 ENCOUNTER — Other Ambulatory Visit: Payer: Self-pay | Admitting: Family Medicine

## 2017-10-24 ENCOUNTER — Telehealth: Payer: Self-pay | Admitting: Family Medicine

## 2017-10-24 MED ORDER — BUPROPION HCL ER (XL) 150 MG PO TB24: 150.0000 mg | ORAL_TABLET | Freq: Every day | ORAL | 1 refills | Status: DC

## 2017-10-24 MED FILL — BUPROPION HCL XL 150 MG TAB: 150 | 90 days supply | Qty: 90 | Fill #0

## 2017-10-24 NOTE — Telephone Encounter (Signed)
Medication filled to pharmacy as requested.   [place] for [facility] to Discuss results / PCP recommendations / Schedule patient.

## 2017-10-24 NOTE — Telephone Encounter (Signed)
Copied from [place]. Topic: Quick Communication - See Telephone Encounter >> Oct 24, 2017  1:39 PM Bea Graff, NT wrote: CRM for notification. See Telephone encounter for: Patient would like to see if she can have her Wellbutrin that was called in changed to 90 days instead of 30 days.   10/24/17.

## 2017-12-07 MED FILL — SHIPPING COST: 1 days supply | Qty: 1 | Fill #0

## 2017-12-07 MED FILL — SERTRALINE HCL 50 MG TABLET: 50 | 30 days supply | Qty: 30 | Fill #1

## 2017-12-21 MED FILL — metFORMIN HCL 500 MG TABS: 500 | 90 days supply | Qty: 180 | Fill #1

## 2017-12-23 MED FILL — SHIPPING COST: 1 days supply | Qty: 1 | Fill #1

## 2018-01-11 MED FILL — SERTRALINE HCL 50 MG TABLET: 50 | 30 days supply | Qty: 30 | Fill #2

## 2018-01-16 MED FILL — buPROPion HCL ER (XL) 150 M: 150 | 90 days supply | Qty: 90 | Fill #1

## 2018-01-16 MED FILL — SHIPPING COST: 1 days supply | Qty: 1 | Fill #2

## 2018-02-02 ENCOUNTER — Telehealth: Payer: No Typology Code available for payment source | Admitting: Physician Assistant

## 2018-02-02 DIAGNOSIS — J069 Acute upper respiratory infection, unspecified: Secondary | ICD-10-CM | POA: Diagnosis not present

## 2018-02-02 MED ORDER — BENZONATATE 100 MG PO CAPS: 100.0000 mg | ORAL_CAPSULE | Freq: Three times a day (TID) | ORAL | 0 refills | Status: DC | PRN

## 2018-02-02 NOTE — Progress Notes (Signed)
We are sorry you are not feeling well.  Here is how we plan to help!  Based on what you have shared with me, it looks like you may have a viral upper respiratory infection or a "common cold".  Colds are caused by a large number of viruses; however, rhinovirus is the most common cause.   Symptoms of the common cold vary from person to person, with common symptoms including sore throat, cough, and malaise.  A low-grade fever of 100.4 may present, but is often uncommon.  Symptoms vary however, and are closely related to a person's age or underlying illnesses.  The most common symptoms associated with the common cold are nasal discharge or congestion, cough, sneezing, headache and pressure in the ears and face.  Cold symptoms usually persist for about 3 to 10 days, but can last up to 2 weeks.  It is important to know that colds do not cause serious illness or complications in most cases.    The common cold is transmitted from person to person, with the most common method of transmission being a person's hands.  The virus is able to live on the skin and can infect other persons for up to 2 hours after direct contact.  Also, colds are transmitted when someone coughs or sneezes; thus, it is important to cover the mouth to reduce this risk.  To keep the spread of the common cold at [place], good hand hygiene is very important.  This is an infection that is most likely caused by a virus. There are no specific treatments for the common cold other than to help you with the symptoms until the infection runs its course.    For nasal congestion, you may use an oral decongestants such as Mucinex D or if you have glaucoma or high blood pressure use plain Mucinex.  Saline nasal spray or nasal drops can help and can safely be used as often as needed for congestion.   If you do not have a history of heart disease, hypertension, diabetes or thyroid disease, prostate/bladder issues or glaucoma, you may also use Sudafed to treat  nasal congestion.  It is highly recommended that you consult with a pharmacist or your primary care physician to ensure this medication is safe for you to take.     If you have a cough, you may use cough suppressants such as Delsym and Robitussin.  If you have glaucoma or high blood pressure, you can also use Coricidin HBP.   For cough I have prescribed for you A prescription cough medication called Tessalon Perles 100 mg. You may take 1-2 capsules every 8 hours as needed for cough  If you have a sore or scratchy throat, use a saltwater gargle-  to  teaspoon of salt dissolved in a 4-ounce to 8-ounce glass of warm water.  Gargle the solution for approximately 15-30 seconds and then spit.  It is important not to swallow the solution.  You can also use throat lozenges/cough drops and Chloraseptic spray to help with throat pain or discomfort.  Warm or cold liquids can also be helpful in relieving throat pain.  For headache, pain or general discomfort, you can use Ibuprofen or Tylenol as directed.   Some authorities believe that zinc sprays or the use of Echinacea may shorten the course of your symptoms.   HOME CARE . Only take medications as instructed by your medical team. . Be sure to drink plenty of fluids. Water is fine as well as fruit  juices, sodas and electrolyte beverages. You may want to stay away from caffeine or alcohol. If you are nauseated, try taking small sips of liquids. How do you know if you are getting enough fluid? Your urine should be a pale yellow or almost colorless. . Get rest. . Taking a steamy shower or using a humidifier may help nasal congestion and ease sore throat pain. You can place a towel over your head and breathe in the steam from hot water coming from a faucet. . Using a saline nasal spray works much the same way. . Cough drops, hard candies and sore throat lozenges may ease your cough. . Avoid close contacts especially the very young and the elderly . Cover your  mouth if you cough or sneeze . Always remember to wash your hands.   GET HELP RIGHT AWAY IF: . You develop worsening fever. . If your symptoms do not improve within 10 days . You become short of breath. . You develop yellow or green discharge from your nose over 3 days. . You have coughing fits . You develop a severe head ache or visual changes. . You develop shortness of breath or difficulty breathing. . Your symptoms persist after you have completed your treatment plan  MAKE SURE YOU   Understand these instructions.  Will watch your condition.  Will get help right away if you are not doing well or get worse.  Your e-visit answers were reviewed by a board certified advanced clinical practitioner to complete your personal care plan. Depending upon the condition, your plan could have included both over the counter or prescription medications. Please review your pharmacy choice. If there is a problem, you may call our nursing hot line at and have the prescription routed to another pharmacy. Your safety is important to us. If you have drug allergies check your prescription carefully.   You can use MyChart to ask questions about today's visit, request a non-urgent call back, or ask for a work or school excuse for 24 hours related to this e-Visit. If it has been greater than 24 hours you will need to follow up with your provider, or enter a new e-Visit to address those concerns. You will get an e-mail in the next two days asking about your experience.  I hope that your e-visit has been valuable and will speed your recovery. Thank you for using e-visits.      

## 2018-02-17 MED FILL — SERTRALINE HCL 50 MG TABLET: 50 | 30 days supply | Qty: 30 | Fill #3

## 2018-03-21 ENCOUNTER — Other Ambulatory Visit: Payer: Self-pay

## 2018-03-21 ENCOUNTER — Encounter: Payer: Self-pay | Admitting: Family Medicine

## 2018-03-21 ENCOUNTER — Ambulatory Visit (INDEPENDENT_AMBULATORY_CARE_PROVIDER_SITE_OTHER): Payer: No Typology Code available for payment source | Admitting: Family Medicine

## 2018-03-21 VITALS — BP 116/80 | HR 64 | Temp 99.3°F | Resp 16 | Ht 66.0 in | Wt 233.5 lb

## 2018-03-21 DIAGNOSIS — R002 Palpitations: Secondary | ICD-10-CM

## 2018-03-21 DIAGNOSIS — Z0000 Encounter for general adult medical examination without abnormal findings: Secondary | ICD-10-CM

## 2018-03-21 DIAGNOSIS — E785 Hyperlipidemia, unspecified: Secondary | ICD-10-CM

## 2018-03-21 DIAGNOSIS — K219 Gastro-esophageal reflux disease without esophagitis: Secondary | ICD-10-CM

## 2018-03-21 DIAGNOSIS — R9431 Abnormal electrocardiogram [ECG] [EKG]: Secondary | ICD-10-CM

## 2018-03-21 DIAGNOSIS — E559 Vitamin D deficiency, unspecified: Secondary | ICD-10-CM

## 2018-03-21 LAB — BASIC METABOLIC PANEL
BUN: 15 mg/dL (ref 6–23)
CHLORIDE: 105 meq/L (ref 96–112)
CO2: 26 mEq/L (ref 19–32)
CREATININE: 0.67 mg/dL (ref 0.40–1.20)
Calcium: 9.4 mg/dL (ref 8.4–10.5)
GFR: 96.71 mL/min (ref >60.00)
Glucose, Bld: 99 mg/dL (ref 70–99)
POTASSIUM: 4.3 meq/L (ref 3.5–5.1)
Sodium: 141 mEq/L (ref 135–145)

## 2018-03-21 LAB — HEPATIC FUNCTION PANEL
ALT: 19 U/L (ref 0–35)
AST: 12 U/L (ref 0–37)
Albumin: 4.3 g/dL (ref 3.5–5.2)
Alkaline Phosphatase: 63 U/L (ref 39–117)
BILIRUBIN TOTAL: 0.4 mg/dL (ref 0.2–1.2)
Bilirubin, Direct: 0.1 mg/dL (ref 0.0–0.3)
TOTAL PROTEIN: 6.7 g/dL (ref 6.0–8.3)

## 2018-03-21 LAB — CBC WITH DIFFERENTIAL/PLATELET
BASOS ABS: 0 10*3/uL (ref 0.0–0.1)
Basophils Relative: 0.9 % (ref 0.0–3.0)
Eosinophils Absolute: 0.1 10*3/uL (ref 0.0–0.7)
Eosinophils Relative: 1.2 % (ref 0.0–5.0)
HCT: 44 % (ref 36.0–46.0)
Hemoglobin: 14.8 g/dL (ref 12.0–15.0)
Lymphocytes Relative: 25.2 % (ref 12.0–46.0)
Lymphs Abs: 1.2 10*3/uL (ref 0.7–4.0)
MCHC: 33.7 g/dL (ref 30.0–36.0)
MCV: 94.2 fl (ref 78.0–100.0)
MONOS PCT: 8.6 % (ref 3.0–12.0)
Monocytes Absolute: 0.4 10*3/uL (ref 0.1–1.0)
NEUTROS ABS: 3.1 10*3/uL (ref 1.4–7.7)
Neutrophils Relative %: 64.1 % (ref 43.0–77.0)
PLATELETS: 247 10*3/uL (ref 150.0–400.0)
RBC: 4.67 Mil/uL (ref 3.87–5.11)
RDW: 13.4 % (ref 11.5–15.5)
WBC: 4.9 10*3/uL (ref 4.0–10.5)

## 2018-03-21 LAB — LIPID PANEL
CHOLESTEROL: 239 mg/dL — AB (ref 0–200)
HDL: 64.9 mg/dL (ref >39.00)
LDL Cholesterol: 152 mg/dL — ABNORMAL HIGH (ref 0–99)
NonHDL: 174.22
TRIGLYCERIDES: 111 mg/dL (ref 0.0–149.0)
Total CHOL/HDL Ratio: 4
VLDL: 22.2 mg/dL (ref 0.0–40.0)

## 2018-03-21 LAB — VITAMIN D 25 HYDROXY (VIT D DEFICIENCY, FRACTURES): VITD: 39.6 ng/mL (ref 30.00–100.00)

## 2018-03-21 LAB — TSH: TSH: 1.62 u[IU]/mL (ref 0.35–4.50)

## 2018-03-21 MED ORDER — METFORMIN HCL 500 MG PO TABS: 500.0000 mg | ORAL_TABLET | Freq: Every day | ORAL | 1 refills | Status: DC

## 2018-03-21 MED ORDER — BUPROPION HCL ER (XL) 150 MG PO TB24: 150.0000 mg | ORAL_TABLET | Freq: Every day | ORAL | 1 refills | Status: DC

## 2018-03-21 MED ORDER — PANTOPRAZOLE SODIUM 40 MG PO TBEC: 40.0000 mg | DELAYED_RELEASE_TABLET | Freq: Every day | ORAL | 1 refills | Status: DC

## 2018-03-21 MED ORDER — SERTRALINE HCL 50 MG PO TABS: 50.0000 mg | ORAL_TABLET | Freq: Every day | ORAL | 1 refills | Status: DC

## 2018-03-21 MED FILL — SHIPPING COST: 1 days supply | Qty: 1 | Fill #3

## 2018-03-21 MED FILL — SERTRALINE HCL 50 MG TABLET: 50 | 90 days supply | Qty: 90 | Fill #0

## 2018-03-21 MED FILL — PANTOPRAZOLE SOD DR 40 MG T: 40 | 90 days supply | Qty: 90 | Fill #0

## 2018-03-21 MED FILL — metFORMIN HCL 500 MG TABS: 500 | 90 days supply | Qty: 90 | Fill #0

## 2018-03-21 NOTE — Patient Instructions (Addendum)
Follow up in 6 months to recheck sugar and cholesterol We'll notify you of your lab results and make any changes if needed Continue to work on healthy diet and regular exercise- you can do it! We'll call you with your Cardiology appt START the Pantoprazole daily to improve reflux Call with any questions or concerns- particularly if palpitations worsen [place]!!

## 2018-03-21 NOTE — Assessment & Plan Note (Signed)
Pt's PE WNL w/ exception of obesity.  UTD on GYN, colonoscopy, immunizations.  Stressed need for healthy diet and regular exercise.  Check labs.  Anticipatory guidance provided.

## 2018-03-21 NOTE — Assessment & Plan Note (Signed)
Check labs and replete prn. 

## 2018-03-21 NOTE — Assessment & Plan Note (Signed)
Chronic problem.  On Red Yeast Rice w/o difficulty.  Stressed need for healthy diet and regular exercise.  Check labs.  Adjust meds prn

## 2018-03-21 NOTE — Progress Notes (Signed)
   Subjective:    Patient ID: Tracy Pitts, female    DOB: [birth year], 57 y.o.   MRN: [id number]  HPI CPE- UTD on GYN [facility]), colonoscopy.     Review of Systems Patient reports no vision/ hearing changes, adenopathy,fever, weight change,  persistant/recurrent hoarseness , swallowing issues, chest pain, edema, persistant/recurrent cough, hemoptysis, dyspnea (rest/exertional/paroxysmal nocturnal), gastrointestinal bleeding (melena, rectal bleeding), abdominal pain, bowel changes, GU symptoms (dysuria, hematuria, incontinence), Gyn symptoms (abnormal  bleeding, pain),  syncope, focal weakness, memory loss, numbness & tingling, skin/hair/nail changes, abnormal bruising or bleeding, anxiety, or depression.   + palpitations- pt reports she will feel her HR 'randomly speed up'.  Denies SOB, CP.  sxs started a couple of months ago.  Episodes will spontaneously resolve in 10-15 minutes.  Denies increased stress.  Minimal caffeine throughout the day.  + GERD- can be severe at time, will wake her from sleep     Objective:   Physical Exam General Appearance:    Alert, cooperative, no distress, appears stated age, obese  Head:    Normocephalic, without obvious abnormality, atraumatic  Eyes:    PERRL, conjunctiva/corneas clear, EOM's intact, fundi    benign, both eyes  Ears:    Normal TM's and external ear canals, both ears  Nose:   Nares normal, septum midline, mucosa normal, no drainage    or sinus tenderness  Throat:   Lips, mucosa, and tongue normal; teeth and gums normal  Neck:   Supple, symmetrical, trachea midline, no adenopathy;    Thyroid: no enlargement/tenderness/nodules  Back:     Symmetric, no curvature, ROM normal, no CVA tenderness  Lungs:     Clear to auscultation bilaterally, respirations unlabored  Chest Wall:    No tenderness or deformity   Heart:    Regular rate and rhythm, S1 and S2 normal, no murmur, rub   or gallop  Breast Exam:    Deferred to GYN  Abdomen:      Soft, non-tender, bowel sounds active all four quadrants,    no masses, no organomegaly  Genitalia:    Deferred to GYN  Rectal:    Extremities:   Extremities normal, atraumatic, no cyanosis or edema  Pulses:   2+ and symmetric all extremities  Skin:   Skin color, texture, turgor normal, no rashes or lesions  Lymph nodes:   Cervical, supraclavicular, and axillary nodes normal  Neurologic:   CNII-XII intact, normal strength, sensation and reflexes    throughout          Assessment & Plan:  GERD- new.  Pt's sxs can be severe and wake her at night.  Start PPI.  Reviewed lifestyle and dietary modifications that will improve sxs.  Will follow  Palpitations- pt has mildly shortened PR interval (116) on EKG.  Asymptomatic here in office but will have 10-15 minute episodes of racing heart 'randomly'.  Based on this, will refer to Cardiology for evaluation.  Pt expressed understanding and is in agreement w/ plan.

## 2018-03-22 ENCOUNTER — Encounter: Payer: Self-pay | Admitting: General Practice

## 2018-04-18 MED FILL — buPROPion HCL ER (XL) 150 M: 150 | 90 days supply | Qty: 90 | Fill #0

## 2018-04-18 MED FILL — SHIPPING COST: 1 days supply | Qty: 1 | Fill #4

## 2018-04-25 ENCOUNTER — Other Ambulatory Visit: Payer: Self-pay | Admitting: Family Medicine

## 2018-04-25 ENCOUNTER — Encounter: Payer: Self-pay | Admitting: Family Medicine

## 2018-04-25 DIAGNOSIS — M25552 Pain in left hip: Principal | ICD-10-CM

## 2018-04-25 DIAGNOSIS — M25551 Pain in right hip: Secondary | ICD-10-CM

## 2018-05-01 DIAGNOSIS — M25552 Pain in left hip: Secondary | ICD-10-CM | POA: Insufficient documentation

## 2018-05-09 ENCOUNTER — Ambulatory Visit: Payer: No Typology Code available for payment source | Admitting: Cardiology

## 2018-05-09 ENCOUNTER — Telehealth: Payer: Self-pay | Admitting: *Deleted

## 2018-05-09 ENCOUNTER — Encounter: Payer: Self-pay | Admitting: Cardiology

## 2018-05-09 VITALS — BP 152/97 | HR 71 | Ht 66.0 in | Wt 235.8 lb

## 2018-05-09 DIAGNOSIS — E669 Obesity, unspecified: Secondary | ICD-10-CM | POA: Diagnosis not present

## 2018-05-09 DIAGNOSIS — G4719 Other hypersomnia: Secondary | ICD-10-CM

## 2018-05-09 DIAGNOSIS — R002 Palpitations: Secondary | ICD-10-CM | POA: Diagnosis not present

## 2018-05-09 NOTE — Telephone Encounter (Signed)
-----   Message from Donnisha Robertson, RN sent at 05/09/2018  9:32 AM EDT ----- Regarding: sleep study  Sleep study ordered   Thanks Rena  

## 2018-05-09 NOTE — Progress Notes (Signed)
Cardiology Office Note    Date:  05/09/2018   ID:  Tracy Pitts, DOB 15-May-1961, MRN [id number]  PCP:  Midge Minium, MD  Cardiologist:  Fransico Him, MD   Chief Complaint  Patient presents with  . Palpitations    History of Present Illness:  Tracy Pitts is a 57 y.o. female who is being seen today for the evaluation of palpitations at the request of Tabori, Aundra Millet, MD.  This is a very pleasant 57 year old female with a history of allergies, arthritis, anxiety and depression as well as GERD who presents for evaluation of palpitations.  She reported to her PCP back in April that she was having some random palpitations.  She says it would just feel like all of a sudden her heart rate would speed up and that would slow down.  The palpitations usually last 5 to 15 minutes at a time but do not occur every day.  She denies any dizziness or syncope with these episodes.  She denies any chest pain, shortness of breath PND orthopnea.  EKG at that time showed normal sinus rhythm.  EKG read out as per short PR interval but in reviewing the EKG the PR interval is normal.  She says that she snores a lot at night and sleeps at least 9 hours but feels tired in the am and throughout the day.  She has never had a sleep study.   Past Medical History:  Diagnosis Date  . Allergy   . Anxiety   . Arthritis   . Cholelithiasis with cholecystitis 02/13/2015  . Depression    Dr Mickle Plumb  . GERD (gastroesophageal reflux disease)   . Prediabetes   . Wears glasses     Past Surgical History:  Procedure Laterality Date  . BREAST BIOPSY Right 1989    Benign  . CESAREAN SECTION  1994  . CHOLECYSTECTOMY N/A 02/17/2015   Procedure: LAPAROSCOPIC CHOLECYSTECTOMY WITH INTRAOPERATIVE CHOLANGIOGRAM;  Surgeon: Armandina Gemma, MD;  Location: [facility];  Service: General;  Laterality: N/A;  . COLONOSCOPY WITH PROPOFOL  02-21-2013    Current Medications: Current Meds  Medication Sig  .  Ascorbic Acid (VITAMIN C) 1000 MG tablet Take 1,000 mg by mouth daily.  Marland Kitchen aspirin EC 81 MG tablet Take 81 mg by mouth daily.  Marland Kitchen buPROPion (WELLBUTRIN XL) 150 MG 24 hr tablet Take 1 tablet (150 mg total) by mouth daily.  . metFORMIN (GLUCOPHAGE) 500 MG tablet Take 1 tablet (500 mg total) by mouth daily with breakfast.  . pantoprazole (PROTONIX) 40 MG tablet Take 1 tablet (40 mg total) by mouth daily.  . Red Yeast Rice Extract (RED YEAST RICE PO) Take by mouth.  . sertraline (ZOLOFT) 50 MG tablet Take 1 tablet (50 mg total) by mouth daily.  Marland Kitchen VITAMIN D, ERGOCALCIFEROL, PO Take 4,000 Units by mouth daily.  Marland Kitchen VITAMIN E PO Take 450 mg by mouth daily.    Allergies:   Percocet [oxycodone-acetaminophen] and Vicodin [hydrocodone-acetaminophen]   Social History   Socioeconomic History  . Marital status: Married    Spouse name: Not on file  . Number of children: Not on file  . Years of education: Not on file  . Highest education level: Not on file  Occupational History  . Not on file  Social Needs  . Financial resource strain: Not on file  . Food insecurity:    Worry: Not on file    Inability: Not on file  . Transportation needs:  Medical: Not on file    Non-medical: Not on file  Tobacco Use  . Smoking status: Never Smoker  . Smokeless tobacco: Never Used  Substance and Sexual Activity  . Alcohol use: Yes    Comment: occasional  . Drug use: No  . Sexual activity: Not on file  Lifestyle  . Physical activity:    Days per week: Not on file    Minutes per session: Not on file  . Stress: Not on file  Relationships  . Social connections:    Talks on phone: Not on file    Gets together: Not on file    Attends religious service: Not on file    Active member of club or organization: Not on file    Attends meetings of clubs or organizations: Not on file    Relationship status: Not on file  Other Topics Concern  . Not on file  Social History Narrative  . Not on file     Family  History:  The patient's family history includes Aortic aneurysm in her paternal grandfather; Arthritis in her father, maternal grandfather, mother, paternal grandfather, and paternal grandmother; Atrial fibrillation in her father; Breast cancer in her maternal aunt; Cancer in her maternal aunt, maternal grandmother, and paternal aunt; Cancer - Other in her maternal aunt; Congenital heart disease in her paternal aunt; Depression in her father, mother, and sister; Heart disease in her paternal aunt; Hyperlipidemia in her father, maternal aunt, maternal aunt, maternal grandfather, maternal uncle, mother, paternal aunt, paternal grandfather, paternal grandmother, paternal uncle, and paternal uncle; Hypertension in her father, maternal aunt, maternal aunt, maternal uncle, mother, paternal aunt, paternal grandfather, paternal grandmother, paternal uncle, and sister.   ROS:   Please see the history of present illness.    ROS All other systems reviewed and are negative.  No flowsheet data found.     PHYSICAL EXAM:   VS:  BP (!) 152/97   Pulse 71   Ht 5\' 6"  (1.676 m)   Wt 235 lb 12.8 oz (107 kg)   LMP 10/07/2011   BMI 38.06 kg/m    GEN: Well nourished, well developed, in no acute distress  HEENT: normal  Neck: no JVD, carotid bruits, or masses Cardiac: RRR; no murmurs, rubs, or gallops,no edema.  Intact distal pulses bilaterally.  Respiratory:  clear to auscultation bilaterally, normal work of breathing GI: soft, nontender, nondistended, + BS MS: no deformity or atrophy  Skin: warm and dry, no rash Neuro:  Alert and Oriented x 3, Strength and sensation are intact Psych: euthymic mood, full affect  Wt Readings from Last 3 Encounters:  05/09/18 235 lb 12.8 oz (107 kg)  03/21/18 233 lb 8 oz (105.9 kg)  09/19/17 234 lb 8 oz (106.4 kg)      Studies/Labs Reviewed:   EKG:  EKG is ordered today.  The ekg ordered 03/21/2018 demonstrates NSR with no PR prolongation (misread by computer)  Recent  Labs: 03/21/2018: ALT 19; BUN 15; Creatinine, Ser 0.67; Hemoglobin 14.8; Platelets 247.0; Potassium 4.3; Sodium 141; TSH 1.62   Lipid Panel    Component Value Date/Time   CHOL 239 (H) 03/21/2018 1043   CHOL 236 (H) 12/25/2014 1136   TRIG 111.0 03/21/2018 1043   TRIG 133 12/25/2014 1136   HDL 64.90 03/21/2018 1043   HDL 67 12/25/2014 1136   CHOLHDL 4 03/21/2018 1043   VLDL 22.2 03/21/2018 1043   LDLCALC 152 (H) 03/21/2018 1043   LDLCALC 142 (H) 12/25/2014 1136  Additional studies/ records that were reviewed today include:  Office notes from PCP    ASSESSMENT:    1. Heart palpitations   2. Excessive daytime sleepiness   3. Obesity (BMI 30-39.9)      PLAN:  In order of problems listed above:  1. Palpitations - I will get a 30 event monitor to assess for arrhythmias. She has a family history of PVCs and PAF.  2.   Excessive daytime sleepiness - I have recommended a split night sleep study to evaluate for OSA.  If she has OSA she is certainly at risk for atrial arrhythmias.   3.  Obesity - I have encouraged her to get into a routine exercise program and cut back on carbs and portions.     Medication Adjustments/Labs and Tests Ordered: Current medicines are reviewed at length with the patient today.  Concerns regarding medicines are outlined above.  Medication changes, Labs and Tests ordered today are listed in the Patient Instructions below.  There are no Patient Instructions on file for this visit.   Signed, Fransico Him, MD  05/09/2018 9:26 AM    [address], [place], [place]  [address] Phone: [phone]; Fax: [phone]

## 2018-05-09 NOTE — Patient Instructions (Signed)
Medication Instructions:  Your physician recommends that you continue on your current medications as directed. Please refer to the Current Medication list given to you today.  If you need a refill on your cardiac medications, please contact your pharmacy first.  Labwork: None ordered   Testing/Procedures: Your physician has recommended that you wear an event monitor. Event monitors are medical devices that record the heart's electrical activity. Doctors most often [place] these monitors to diagnose arrhythmias. Arrhythmias are problems with the speed or rhythm of the heartbeat. The monitor is a small, portable device. You can wear one while you do your normal daily activities. This is usually used to diagnose what is causing palpitations/syncope (passing out).  Your physician has recommended that you have a sleep study. This test records several body functions during sleep, including: brain activity, eye movement, oxygen and carbon dioxide blood levels, heart rate and rhythm, breathing rate and rhythm, the flow of air through your mouth and nose, snoring, body muscle movements, and chest and belly movement.  Follow-Up: Your physician wants you to follow-up AS NEEDED with Dr. Radford Pax   Any Other Special Instructions Will Be Listed Below (If Applicable).   Thank you for choosing [facility], RN  [phone]  If you need a refill on your cardiac medications before your next appointment, please call your pharmacy.

## 2018-05-10 ENCOUNTER — Telehealth: Payer: Self-pay | Admitting: *Deleted

## 2018-05-10 NOTE — Telephone Encounter (Signed)
Faxed clinical notes to [facility] for sleep study PA.

## 2018-05-12 ENCOUNTER — Ambulatory Visit (INDEPENDENT_AMBULATORY_CARE_PROVIDER_SITE_OTHER): Payer: No Typology Code available for payment source

## 2018-05-12 DIAGNOSIS — R002 Palpitations: Secondary | ICD-10-CM

## 2018-05-17 ENCOUNTER — Telehealth: Payer: Self-pay | Admitting: *Deleted

## 2018-05-17 NOTE — Telephone Encounter (Signed)
-----   Message from Teressa Senter, RN sent at 05/09/2018  9:32 AM EDT ----- Regarding: sleep study  Sleep study ordered   Thanks Rena

## 2018-05-17 NOTE — Telephone Encounter (Signed)
Patient notified of sleep study appointment scheduled @ [facility] on 06/28/18.

## 2018-05-18 LAB — HM PAP SMEAR

## 2018-05-18 LAB — HM MAMMOGRAPHY: HM Mammogram: NORMAL (ref 0–4)

## 2018-05-19 ENCOUNTER — Encounter: Payer: Self-pay | Admitting: General Practice

## 2018-05-24 LAB — HM PAP SMEAR

## 2018-06-01 ENCOUNTER — Other Ambulatory Visit: Payer: Self-pay | Admitting: *Deleted

## 2018-06-01 NOTE — Patient Outreach (Addendum)
[facility]) Care Management  06/01/2018  Tracy Pitts 1961-10-09 [id number]   Subjective: Telephone call to patient's home / mobile number, spoke with patient, and HIPAA verified.  Discussed [name] Care Management Focus Plan transition of care follow up, preoperative call follow up, patient voiced understanding, and is in agreement to both types of follow up.  Patient states she is doing well, ready for -hip surgery on 06/20/18 at [facility], estimated length of stay 1 -2 days.  Discussed importance of hospital follow up with primary MD, patient voices understanding, and states  she will follow up as appropriate. Discussed Advanced Directives, advised of [employer]ual [employer] Advanced Directives document completion benefit, patient voices understanding, will access benefit at a later time, and given contact number  [phone]) to access benefit.   Patient voices understanding of medical diagnosis, pending surgery, and treatment plan. Patient states she is able to manage self care and has assistance as needed with activities of daily living / home management post hospital discharge. States she is accessing the following [employer] benefits: outpatient pharmacy, hospital indemnity (not chosen), and has family medical leave act [employer]) in place.   Patient states she does not have any preoperative questions, care coordination, disease management, disease monitoring, transportation, community resource, or pharmacy needs at this time.  States he is very appreciative of the follow up and is in agreement to receive [place] Management information post transition of care follow up.      Objective: Per KPN (Knowledge Performance Now, point of care tool) and chart review, patient to be admitted 06/20/18 for LEFT TOTAL HIP ARTHROPLASTY ANTERIOR APPROACH.   Patient also has a history of hyperlipidemia and prediabetes.      Assessment: Received Focus Plan Preoperative / Transition  of care referral on 05/31/18.  Preoperative call completed, and transition of care follow up pending notification of patient discharge.       Plan: RNCM will call patient for  telephone outreach attempt, transition of care follow up, within 3 business days of hospital discharge notification.       Tracy Pitts H. Annia Friendly, BSN, [place] Management [facility] Telephonic CM Phone: [phone] Fax: [phone]

## 2018-06-06 ENCOUNTER — Encounter: Payer: Self-pay | Admitting: General Practice

## 2018-06-08 ENCOUNTER — Telehealth: Payer: Self-pay

## 2018-06-08 ENCOUNTER — Telehealth: Payer: Self-pay | Admitting: Family Medicine

## 2018-06-08 ENCOUNTER — Telehealth: Payer: Self-pay | Admitting: Internal Medicine

## 2018-06-08 ENCOUNTER — Encounter: Payer: Self-pay | Admitting: Cardiology

## 2018-06-08 NOTE — Telephone Encounter (Signed)
Per Chart pt having rt hip arthroplasty. Pt had her CPE 03/19/2018. Please advise.

## 2018-06-08 NOTE — Telephone Encounter (Signed)
Patient dropped off surgery clearance form. Her surgery is scheduled for 06/20/18. Please contact patient when complete.

## 2018-06-08 NOTE — Telephone Encounter (Signed)
   Primary Cardiologist: Fransico Him, MD  Chart reviewed as part of pre-operative protocol coverage. Given past medical history and time since last visit, based on ACC/AHA guidelines, Tracy Pitts would be at acceptable risk for the planned procedure without further cardiovascular testing.   I will route this recommendation to the requesting party via Epic fax function and remove from pre-op pool.  Please call with questions.  Cecilie Kicks, NP 06/08/2018, 4:52 PM

## 2018-06-08 NOTE — Telephone Encounter (Signed)
Walk In Pt Form-Surgical Clearance dropped off. Placed in Triage box to be addressed.

## 2018-06-08 NOTE — Telephone Encounter (Signed)
   [facility] Pre-operative Risk Assessment    Request for surgical clearance:  1. What type of surgery is being performed? Left Total Hip Arthroplasty   2. When is this surgery scheduled? June 20, 2018   3. What type of clearance is required (medical clearance vs. Pharmacy clearance to hold med vs. Both)? Medical clearance  4. Are there any medications that need to be held prior to surgery and how long? Not on request   5. Practice name and name of physician performing surgery? Dr. Paralee Cancel at [facility]  6. What is your office phone number [phone]   7.   What is your office fax number  [phone]  8.   Anesthesia type (None, local, MAC, general) ? Spinal   Damian Leavell 06/08/2018, 2:34 PM  _________________________________________________________________   (provider comments below)

## 2018-06-09 NOTE — Telephone Encounter (Signed)
Form with EKG & labs have been faxed.

## 2018-06-09 NOTE — Telephone Encounter (Signed)
Form completed and placed in basket.  Will need Office Note, EKG, and labs printed from 03/21/18

## 2018-06-12 NOTE — H&P (Signed)
TOTAL HIP ADMISSION H&P  Patient is admitted for left total hip arthroplasty, anterior approach.  Subjective:  Chief Complaint:     Left hip primary OA / pain  HPI: Tracy Pitts, 57 y.o. female, has a history of pain and functional disability in the left hip(s) due to arthritis and patient has failed non-surgical conservative treatments for greater than 12 weeks to include NSAID's and/or analgesics, use of assistive devices and activity modification.  Onset of symptoms was gradual starting <1 years ago with rapidlly worsening course since that time.The patient noted no past surgery on the left hip(s).  Patient currently rates pain in the left hip at 8 out of 10 with activity. Patient has night pain, worsening of pain with activity and weight bearing, trendelenberg gait, pain that interfers with activities of daily living and pain with passive range of motion. Patient has evidence of periarticular osteophytes and joint space narrowing by imaging studies. This condition presents safety issues increasing the risk of falls. There is no current active infection.  Risks, benefits and expectations were discussed with the patient.  Risks including but not limited to the risk of anesthesia, blood clots, nerve damage, blood vessel damage, failure of the prosthesis, infection and up to and including death.  Patient understand the risks, benefits and expectations and wishes to proceed with surgery.   PCP: Midge Minium, MD  D/C Plans:       Home   Post-op Meds:       No Rx given   Tranexamic Acid:      To be given - IV   Decadron:      Is to be given  FYI:      ASA  Norco  DME:  Rx given for - RW   PT:  No PT    Patient Active Problem List   Diagnosis Date Noted  . Heart palpitations 05/09/2018  . Excessive daytime sleepiness 05/09/2018  . Obesity (BMI 30-39.9) 05/09/2018  . Physical exam 03/21/2018  . Vitamin D deficiency 03/21/2018  . Cholelithiasis 02/17/2015  . Cholelithiasis  with cholecystitis 02/13/2015  . Hyperlipidemia 12/27/2014  . Pre-diabetes 12/25/2014  . Anxiety and depression 12/25/2014   Past Medical History:  Diagnosis Date  . Allergy   . Anxiety   . Arthritis   . Cholelithiasis with cholecystitis 02/13/2015  . Depression    Dr Mickle Plumb  . GERD (gastroesophageal reflux disease)   . Prediabetes   . Wears glasses     Past Surgical History:  Procedure Laterality Date  . BREAST BIOPSY Right 1989    Benign  . CESAREAN SECTION  1994  . CHOLECYSTECTOMY N/A 02/17/2015   Procedure: LAPAROSCOPIC CHOLECYSTECTOMY WITH INTRAOPERATIVE CHOLANGIOGRAM;  Surgeon: Armandina Gemma, MD;  Location: [facility];  Service: General;  Laterality: N/A;  . COLONOSCOPY WITH PROPOFOL  02-21-2013    No current facility-administered medications for this encounter.    Current Outpatient Medications  Medication Sig Dispense Refill Last Dose  . Ascorbic Acid (VITAMIN C) 1000 MG tablet Take 1,000 mg by mouth daily.   Taking  . aspirin EC 81 MG tablet Take 81 mg by mouth daily.   Taking  . buPROPion (WELLBUTRIN XL) 150 MG 24 hr tablet Take 1 tablet (150 mg total) by mouth daily. 90 tablet 1 Taking  . Cholecalciferol (VITAMIN D3) 2000 units TABS Take 6,000 Units by mouth daily.     Marland Kitchen ibuprofen (ADVIL,MOTRIN) 200 MG tablet Take 600 mg by mouth every 8 (eight)  hours as needed (for pain.).     Marland Kitchen metFORMIN (GLUCOPHAGE) 500 MG tablet Take 1 tablet (500 mg total) by mouth daily with breakfast. 90 tablet 1 Taking  . pantoprazole (PROTONIX) 40 MG tablet Take 1 tablet (40 mg total) by mouth daily. 90 tablet 1 Taking  . Red Yeast Rice 600 MG CAPS Take 600 mg by mouth daily.     . sertraline (ZOLOFT) 50 MG tablet Take 1 tablet (50 mg total) by mouth daily. 90 tablet 1 Taking  . vitamin E 400 UNIT capsule Take 400 Units by mouth daily.      Allergies  Allergen Reactions  . Percocet [Oxycodone-Acetaminophen] Itching  . Vicodin [Hydrocodone-Acetaminophen] Itching    Social  History   Tobacco Use  . Smoking status: Never Smoker  . Smokeless tobacco: Never Used  Substance Use Topics  . Alcohol use: Yes    Comment: occasional    Family History  Problem Relation Age of Onset  . Arthritis Mother   . Hyperlipidemia Mother   . Hypertension Mother   . Depression Mother   . Arthritis Father   . Hyperlipidemia Father   . Hypertension Father   . Depression Father   . Atrial fibrillation Father   . Hypertension Sister   . Depression Sister   . Arthritis Maternal Grandfather   . Hyperlipidemia Maternal Grandfather   . Arthritis Paternal Grandfather   . Hyperlipidemia Paternal Grandfather   . Hypertension Paternal Grandfather   . Aortic aneurysm Paternal Grandfather   . Arthritis Paternal Grandmother   . Hyperlipidemia Paternal Grandmother   . Hypertension Paternal Grandmother   . Breast cancer Maternal Aunt   . Hypertension Maternal Aunt   . Hyperlipidemia Maternal Aunt   . Cancer Maternal Aunt        breast  . Hyperlipidemia Paternal Aunt   . Heart disease Paternal Aunt   . Cancer Paternal Aunt        breast  . Congenital heart disease Paternal Aunt   . Hyperlipidemia Paternal Uncle   . Hyperlipidemia Maternal Aunt   . Hyperlipidemia Maternal Uncle   . Hypertension Maternal Aunt   . Hypertension Maternal Uncle   . Hypertension Paternal Aunt   . Hypertension Paternal Uncle   . Hyperlipidemia Paternal Uncle   . Cancer - Other Maternal Aunt        cns  . Cancer Maternal Grandmother        gallbladder  . Colon cancer Neg Hx   . Esophageal cancer Neg Hx   . Stomach cancer Neg Hx   . Rectal cancer Neg Hx      Review of Systems  Constitutional: Negative.   HENT: Negative.   Eyes: Negative.   Respiratory: Negative.   Cardiovascular: Negative.   Gastrointestinal: Positive for heartburn.  Genitourinary: Negative.   Musculoskeletal: Positive for joint pain.  Skin: Negative.   Neurological: Negative.   Endo/Heme/Allergies: Negative.    Psychiatric/Behavioral: Positive for depression.    Objective:  Physical Exam  Constitutional: She is oriented to person, place, and time. She appears well-developed.  HENT:  Head: Normocephalic.  Eyes: Pupils are equal, round, and reactive to light.  Neck: Neck supple. No JVD present. No tracheal deviation present. No thyromegaly present.  Cardiovascular: Normal rate, regular rhythm and intact distal pulses.  Respiratory: Effort normal and breath sounds normal. No respiratory distress. She has no wheezes.  GI: Soft. There is no tenderness. There is no guarding.  Musculoskeletal:  Left hip: She exhibits decreased range of motion, decreased strength, tenderness and bony tenderness. She exhibits no swelling, no deformity and no laceration.  Lymphadenopathy:    She has no cervical adenopathy.  Neurological: She is alert and oriented to person, place, and time.  Skin: Skin is warm and dry.  Psychiatric: She has a normal mood and affect.     Labs:  Estimated body mass index is 38.06 kg/m as calculated from the following:   Height as of 05/09/18: 5\' 6"  (1.676 m).   Weight as of 05/09/18: 107 kg (235 lb 12.8 oz).   Imaging Review Plain radiographs demonstrate severe degenerative joint disease of the left hip(s). The bone quality appears to be good for age and reported activity level.    Preoperative templating of the joint replacement has been completed, documented, and submitted to the Operating Room personnel in order to optimize intra-operative equipment management.     Assessment/Plan:  End stage arthritis, left hip  The patient history, physical examination, clinical judgement of the provider and imaging studies are consistent with end stage degenerative joint disease of the left hip and total hip arthroplasty is deemed medically necessary. The treatment options including medical management, injection therapy, arthroscopy and arthroplasty were discussed at length. The  risks and benefits of total hip arthroplasty were presented and reviewed. The risks due to aseptic loosening, infection, stiffness, dislocation/subluxation,  thromboembolic complications and other imponderables were discussed.  The patient acknowledged the explanation, agreed to proceed with the plan and consent was signed. Patient is being admitted for inpatient treatment for surgery, pain control, PT, OT, prophylactic antibiotics, VTE prophylaxis, progressive ambulation and ADL's and discharge planning.The patient is planning to be discharged home.    [place] Khallid Pasillas   PA-C  06/12/2018, 11:29 AM

## 2018-06-14 MED FILL — PANTOPRAZOLE SOD DR 40 MG T: 40 | 90 days supply | Qty: 90 | Fill #1

## 2018-06-14 MED FILL — SERTRALINE HCL 50 MG TABLET: 50 | 90 days supply | Qty: 90 | Fill #1

## 2018-06-14 MED FILL — SHIPPING COST: 1 days supply | Qty: 1 | Fill #5

## 2018-06-14 NOTE — Progress Notes (Signed)
CARDIAC CLEARANCE 06-08-18 Epic   EKG 03-21-18 Epic

## 2018-06-14 NOTE — Patient Instructions (Signed)
Tracy Pitts  06/14/2018   Your procedure is scheduled on: 06-20-18   Report to [facility] Main  Entrance    Report to admitting at 6:00AM    Call this number if you have problems the morning of surgery [phone]     Remember: Do not eat food or drink liquids :After Midnight.     Take these medicines the morning of surgery with A SIP OF WATER: WELLBUTRIN, PANTOPRAZOLE, SERTRALINE    DO NOT TAKE ANY DIABETIC MEDICATIONS DAY OF YOUR SURGERY                               You may not have any metal on your body including hair pins and              piercings  Do not wear jewelry, make-up, lotions, powders or perfumes, deodorant             Do not wear nail polish.  Do not shave  48 hours prior to surgery.    Do not bring valuables to the hospital. [place].  Contacts, dentures or bridgework may not be worn into surgery.  Leave suitcase in the car. After surgery it may be brought to your room.                   Please read over the following fact sheets you were given: _____________________________________________________________________             [facility] - Preparing for Surgery Before surgery, you can play an important role.  Because skin is not sterile, your skin needs to be as free of germs as possible.  You can reduce the number of germs on your skin by washing with CHG (chlorahexidine gluconate) soap before surgery.  CHG is an antiseptic cleaner which kills germs and bonds with the skin to continue killing germs even after washing. Please DO NOT use if you have an allergy to CHG or antibacterial soaps.  If your skin becomes reddened/irritated stop using the CHG and inform your nurse when you arrive at Short Stay. Do not shave (including legs and underarms) for at least 48 hours prior to the first CHG shower.  You may shave your face/neck. Please follow these instructions carefully:  1.   Shower with CHG Soap the night before surgery and the  morning of Surgery.  2.  If you choose to wash your hair, wash your hair first as usual with your  normal  shampoo.  3.  After you shampoo, rinse your hair and body thoroughly to remove the  shampoo.                           4.  Use CHG as you would any other liquid soap.  You can apply chg directly  to the skin and wash                       Gently with a scrungie or clean washcloth.  5.  Apply the CHG Soap to your body ONLY FROM THE NECK DOWN.   Do not use on face/ open  Wound or open sores. Avoid contact with eyes, ears mouth and genitals (private parts).                       Wash face,  Genitals (private parts) with your normal soap.             6.  Wash thoroughly, paying special attention to the area where your surgery  will be performed.  7.  Thoroughly rinse your body with warm water from the neck down.  8.  DO NOT shower/wash with your normal soap after using and rinsing off  the CHG Soap.                9.  Pat yourself dry with a clean towel.            10.  Wear clean pajamas.            11.  Place clean sheets on your bed the night of your first shower and do not  sleep with pets. Day of Surgery : Do not apply any lotions/deodorants the morning of surgery.  Please wear clean clothes to the hospital/surgery center.  FAILURE TO FOLLOW THESE INSTRUCTIONS MAY RESULT IN THE CANCELLATION OF YOUR SURGERY PATIENT SIGNATURE_________________________________  NURSE SIGNATURE__________________________________  ________________________________________________________________________   Adam Phenix  An incentive spirometer is a tool that can help keep your lungs clear and active. This tool measures how well you are filling your lungs with each breath. Taking long deep breaths may help reverse or decrease the chance of developing breathing (pulmonary) problems (especially infection) following:  A long  period of time when you are unable to move or be active. BEFORE THE PROCEDURE   If the spirometer includes an indicator to show your best effort, your nurse or respiratory therapist will set it to a desired goal.  If possible, sit up straight or lean slightly forward. Try not to slouch.  Hold the incentive spirometer in an upright position. INSTRUCTIONS FOR USE  1. Sit on the edge of your bed if possible, or sit up as far as you can in bed or on a chair. 2. Hold the incentive spirometer in an upright position. 3. Breathe out normally. 4. Place the mouthpiece in your mouth and seal your lips tightly around it. 5. Breathe in slowly and as deeply as possible, raising the piston or the ball toward the top of the column. 6. Hold your breath for 3-5 seconds or for as long as possible. Allow the piston or ball to fall to the bottom of the column. 7. Remove the mouthpiece from your mouth and breathe out normally. 8. Rest for a few seconds and repeat Steps 1 through 7 at least 10 times every 1-2 hours when you are awake. Take your time and take a few normal breaths between deep breaths. 9. The spirometer may include an indicator to show your best effort. Use the indicator as a goal to work toward during each repetition. 10. After each set of 10 deep breaths, practice coughing to be sure your lungs are clear. If you have an incision (the cut made at the time of surgery), support your incision when coughing by placing a pillow or rolled up towels firmly against it. Once you are able to get out of bed, walk around indoors and cough well. You may stop using the incentive spirometer when instructed by your caregiver.  RISKS AND COMPLICATIONS  Take your time so you do not get  dizzy or light-headed.  If you are in pain, you may need to take or ask for pain medication before doing incentive spirometry. It is harder to take a deep breath if you are having pain. AFTER USE  Rest and breathe slowly and  easily.  It can be helpful to keep track of a log of your progress. Your caregiver can provide you with a simple table to help with this. If you are using the spirometer at home, follow these instructions: [place] IF:   You are having difficultly using the spirometer.  You have trouble using the spirometer as often as instructed.  Your pain medication is not giving enough relief while using the spirometer.  You develop fever of 100.5 F (38.1 C) or higher. SEEK IMMEDIATE MEDICAL CARE IF:   You cough up bloody sputum that had not been present before.  You develop fever of 102 F (38.9 C) or greater.  You develop worsening pain at or near the incision site. MAKE SURE YOU:   Understand these instructions.  Will watch your condition.  Will get help right away if you are not doing well or get worse. Document Released: 03/21/2007 Document Revised: 01/31/2012 Document Reviewed: 05/22/2007 ExitCare Patient Information 2014 ExitCare, [place].   ________________________________________________________________________  WHAT IS A BLOOD TRANSFUSION? Blood Transfusion Information  A transfusion is the replacement of blood or some of its parts. Blood is made up of multiple cells which provide different functions.  Red blood cells carry oxygen and are used for blood loss replacement.  White blood cells fight against infection.  Platelets control bleeding.  Plasma helps clot blood.  Other blood products are available for specialized needs, such as hemophilia or other clotting disorders. BEFORE THE TRANSFUSION  Who gives blood for transfusions?   Healthy volunteers who are fully evaluated to make sure their blood is safe. This is blood bank blood. Transfusion therapy is the safest it has ever been in the practice of medicine. Before blood is taken from a donor, a complete history is taken to make sure that person has no history of diseases nor engages in risky social  behavior (examples are intravenous drug use or sexual activity with multiple partners). The donor's travel history is screened to minimize risk of transmitting infections, such as malaria. The donated blood is tested for signs of infectious diseases, such as HIV and hepatitis. The blood is then tested to be sure it is compatible with you in order to minimize the chance of a transfusion reaction. If you or a relative donates blood, this is often done in anticipation of surgery and is not appropriate for emergency situations. It takes many days to process the donated blood. RISKS AND COMPLICATIONS Although transfusion therapy is very safe and saves many lives, the main dangers of transfusion include:   Getting an infectious disease.  Developing a transfusion reaction. This is an allergic reaction to something in the blood you were given. Every precaution is taken to prevent this. The decision to have a blood transfusion has been considered carefully by your caregiver before blood is given. Blood is not given unless the benefits outweigh the risks. AFTER THE TRANSFUSION  Right after receiving a blood transfusion, you will usually feel much better and more energetic. This is especially true if your red blood cells have gotten low (anemic). The transfusion raises the level of the red blood cells which carry oxygen, and this usually causes an energy increase.  The nurse administering the transfusion will  monitor you carefully for complications. HOME CARE INSTRUCTIONS  No special instructions are needed after a transfusion. You may find your energy is better. Speak with your caregiver about any limitations on activity for underlying diseases you may have. SEEK MEDICAL CARE IF:   Your condition is not improving after your transfusion.  You develop redness or irritation at the intravenous (IV) site. SEEK IMMEDIATE MEDICAL CARE IF:  Any of the following symptoms occur over the next 12 hours:  Shaking  chills.  You have a temperature by mouth above 102 F (38.9 C), not controlled by medicine.  Chest, back, or muscle pain.  People around you feel you are not acting correctly or are confused.  Shortness of breath or difficulty breathing.  Dizziness and fainting.  You get a rash or develop hives.  You have a decrease in urine output.  Your urine turns a dark color or changes to pink, red, or brown. Any of the following symptoms occur over the next 10 days:  You have a temperature by mouth above 102 F (38.9 C), not controlled by medicine.  Shortness of breath.  Weakness after normal activity.  The white part of the eye turns yellow (jaundice).  You have a decrease in the amount of urine or are urinating less often.  Your urine turns a dark color or changes to pink, red, or brown. Document Released: 11/05/2000 Document Revised: 01/31/2012 Document Reviewed: 06/24/2008 [facility] Patient Information 2014 [place], [place].  _______________________________________________________________________

## 2018-06-15 ENCOUNTER — Encounter (HOSPITAL_COMMUNITY)
Admission: RE | Admit: 2018-06-15 | Discharge: 2018-06-15 | Disposition: A | Discharge status: Home / Self Care | Payer: No Typology Code available for payment source | Source: Ambulatory Visit | Attending: Orthopedic Surgery | Admitting: Orthopedic Surgery

## 2018-06-15 ENCOUNTER — Encounter (HOSPITAL_COMMUNITY): Payer: Self-pay

## 2018-06-15 ENCOUNTER — Other Ambulatory Visit: Payer: Self-pay

## 2018-06-15 DIAGNOSIS — F329 Major depressive disorder, single episode, unspecified: Secondary | ICD-10-CM | POA: Insufficient documentation

## 2018-06-15 DIAGNOSIS — Z6837 Body mass index (BMI) 37.0-37.9, adult: Secondary | ICD-10-CM | POA: Insufficient documentation

## 2018-06-15 DIAGNOSIS — F419 Anxiety disorder, unspecified: Secondary | ICD-10-CM | POA: Diagnosis not present

## 2018-06-15 DIAGNOSIS — Z9889 Other specified postprocedural states: Secondary | ICD-10-CM | POA: Diagnosis not present

## 2018-06-15 DIAGNOSIS — R7303 Prediabetes: Secondary | ICD-10-CM | POA: Insufficient documentation

## 2018-06-15 DIAGNOSIS — M1612 Unilateral primary osteoarthritis, left hip: Secondary | ICD-10-CM | POA: Diagnosis not present

## 2018-06-15 DIAGNOSIS — Z01812 Encounter for preprocedural laboratory examination: Secondary | ICD-10-CM | POA: Insufficient documentation

## 2018-06-15 DIAGNOSIS — M25552 Pain in left hip: Secondary | ICD-10-CM | POA: Diagnosis not present

## 2018-06-15 DIAGNOSIS — K219 Gastro-esophageal reflux disease without esophagitis: Secondary | ICD-10-CM | POA: Insufficient documentation

## 2018-06-15 HISTORY — DX: Other complications of anesthesia, initial encounter: T88.59XA

## 2018-06-15 HISTORY — DX: Adverse effect of unspecified anesthetic, initial encounter: T41.45XA

## 2018-06-15 LAB — HEMOGLOBIN A1C
Hgb A1c MFr Bld: 5.7 % — ABNORMAL HIGH (ref 4.8–5.6)
Mean Plasma Glucose: 116.89 mg/dL

## 2018-06-15 LAB — BASIC METABOLIC PANEL
ANION GAP: 10 (ref 5–15)
BUN: 17 mg/dL (ref 6–20)
CO2: 26 mmol/L (ref 22–32)
Calcium: 9.3 mg/dL (ref 8.9–10.3)
Chloride: 106 mmol/L (ref 98–111)
Creatinine, Ser: 0.73 mg/dL (ref 0.44–1.00)
GFR calc Af Amer: >60 mL/min (ref >60)
Glucose, Bld: 89 mg/dL (ref 70–99)
Potassium: 4.1 mmol/L (ref 3.5–5.1)
SODIUM: 142 mmol/L (ref 135–145)

## 2018-06-15 LAB — CBC
HCT: 43.5 % (ref 36.0–46.0)
Hemoglobin: 14.6 g/dL (ref 12.0–15.0)
MCH: 31.7 pg (ref 26.0–34.0)
MCHC: 33.6 g/dL (ref 30.0–36.0)
MCV: 94.6 fL (ref 78.0–100.0)
Platelets: 281 10*3/uL (ref 150–400)
RBC: 4.6 MIL/uL (ref 3.87–5.11)
RDW: 13.1 % (ref 11.5–15.5)
WBC: 5.8 10*3/uL (ref 4.0–10.5)

## 2018-06-15 LAB — SURGICAL PCR SCREEN
MRSA, PCR: NEGATIVE
Staphylococcus aureus: NEGATIVE

## 2018-06-15 LAB — GLUCOSE, CAPILLARY: Glucose-Capillary: 78 mg/dL (ref 70–99)

## 2018-06-15 LAB — ABO/RH: ABO/RH(D): A NEG

## 2018-06-19 NOTE — Anesthesia Preprocedure Evaluation (Addendum)
Anesthesia Evaluation  Patient identified by MRN, date of birth, ID band Patient awake    Reviewed: Allergy & Precautions, H&P , NPO status , Patient's Chart, lab work & pertinent test results  Airway Mallampati: II  TM Distance: >3 FB Neck ROM: Full    Dental no notable dental hx. (+) Teeth Intact, Dental Advisory Given   Pulmonary neg pulmonary ROS,    Pulmonary exam normal breath sounds clear to auscultation       Cardiovascular Exercise Tolerance: Good negative cardio ROS   Rhythm:Regular Rate:Normal     Neuro/Psych Anxiety Depression negative neurological ROS     GI/Hepatic Neg liver ROS, GERD  Medicated and Controlled,  Endo/Other  Morbid obesity  Renal/GU negative Renal ROS  negative genitourinary   Musculoskeletal  (+) Arthritis , Osteoarthritis,    Abdominal   Peds  Hematology negative hematology ROS (+)   Anesthesia Other Findings   Reproductive/Obstetrics negative OB ROS                           Anesthesia Physical Anesthesia Plan  ASA: II  Anesthesia Plan: Spinal   Post-op Pain Management:    Induction: Intravenous  PONV Risk Score and Plan: 3 and Propofol infusion, Midazolam, Ondansetron and Dexamethasone  Airway Management Planned: Simple Face Mask  Additional Equipment:   Intra-op Plan:   Post-operative Plan:   Informed Consent: I have reviewed the patients History and Physical, chart, labs and discussed the procedure including the risks, benefits and alternatives for the proposed anesthesia with the patient or authorized representative who has indicated his/her understanding and acceptance.   Dental advisory given  Plan Discussed with: CRNA  Anesthesia Plan Comments:         Anesthesia Quick Evaluation

## 2018-06-20 ENCOUNTER — Encounter (HOSPITAL_COMMUNITY): Payer: Self-pay

## 2018-06-20 ENCOUNTER — Inpatient Hospital Stay (HOSPITAL_COMMUNITY): Payer: No Typology Code available for payment source | Admitting: Anesthesiology

## 2018-06-20 ENCOUNTER — Inpatient Hospital Stay (HOSPITAL_COMMUNITY): Payer: No Typology Code available for payment source

## 2018-06-20 ENCOUNTER — Other Ambulatory Visit: Payer: Self-pay

## 2018-06-20 ENCOUNTER — Inpatient Hospital Stay (HOSPITAL_COMMUNITY)
Admission: RE | Admit: 2018-06-20 | Discharge: 2018-06-21 | DRG: 470 | Disposition: A | Discharge status: Home / Self Care | Payer: No Typology Code available for payment source | Attending: Orthopedic Surgery | Admitting: Orthopedic Surgery

## 2018-06-20 ENCOUNTER — Encounter (HOSPITAL_COMMUNITY)
Admission: RE | Disposition: A | Discharge status: Home / Self Care | Payer: Self-pay | Source: Ambulatory Visit | Attending: Orthopedic Surgery

## 2018-06-20 DIAGNOSIS — E559 Vitamin D deficiency, unspecified: Secondary | ICD-10-CM | POA: Diagnosis present

## 2018-06-20 DIAGNOSIS — K219 Gastro-esophageal reflux disease without esophagitis: Secondary | ICD-10-CM | POA: Diagnosis present

## 2018-06-20 DIAGNOSIS — M1612 Unilateral primary osteoarthritis, left hip: Secondary | ICD-10-CM | POA: Diagnosis present

## 2018-06-20 DIAGNOSIS — Z6838 Body mass index (BMI) 38.0-38.9, adult: Secondary | ICD-10-CM

## 2018-06-20 DIAGNOSIS — Z7984 Long term (current) use of oral hypoglycemic drugs: Secondary | ICD-10-CM

## 2018-06-20 DIAGNOSIS — Z7982 Long term (current) use of aspirin: Secondary | ICD-10-CM

## 2018-06-20 DIAGNOSIS — Z96649 Presence of unspecified artificial hip joint: Secondary | ICD-10-CM

## 2018-06-20 DIAGNOSIS — E785 Hyperlipidemia, unspecified: Secondary | ICD-10-CM | POA: Diagnosis present

## 2018-06-20 DIAGNOSIS — R7303 Prediabetes: Secondary | ICD-10-CM | POA: Diagnosis present

## 2018-06-20 HISTORY — PX: TOTAL HIP ARTHROPLASTY: SHX124

## 2018-06-20 LAB — GLUCOSE, CAPILLARY
GLUCOSE-CAPILLARY: 103 mg/dL — AB (ref 70–99)
GLUCOSE-CAPILLARY: 142 mg/dL — AB (ref 70–99)
GLUCOSE-CAPILLARY: 123 mg/dL — AB (ref 70–99)
Glucose-Capillary: 189 mg/dL — ABNORMAL HIGH (ref 70–99)

## 2018-06-20 LAB — TYPE AND SCREEN
ABO/RH(D): A NEG
Antibody Screen: NEGATIVE

## 2018-06-20 SURGERY — ARTHROPLASTY, HIP, TOTAL, ANTERIOR APPROACH
Anesthesia: Spinal | Site: Hip | Laterality: Left

## 2018-06-20 MED ORDER — ACETAMINOPHEN 325 MG PO TABS
325.0000 mg | ORAL_TABLET | Freq: Four times a day (QID) | ORAL | Status: DC | PRN
  Filled 2018-06-20: qty 2

## 2018-06-20 MED ORDER — ONDANSETRON HCL 4 MG/2ML IJ SOLN
INTRAMUSCULAR | Status: DC | PRN
  Administered 2018-06-20: 4 mg via INTRAVENOUS

## 2018-06-20 MED ORDER — HYDROMORPHONE HCL 1 MG/ML IJ SOLN
INTRAMUSCULAR | Status: AC
  Filled 2018-06-20: qty 2

## 2018-06-20 MED ORDER — TRANEXAMIC ACID 1000 MG/10ML IV SOLN
INTRAVENOUS | Status: AC
  Filled 2018-06-20: qty 10

## 2018-06-20 MED ORDER — POLYETHYLENE GLYCOL 3350 17 G PO PACK
17.0000 g | PACK | Freq: Two times a day (BID) | ORAL | Status: DC
  Administered 2018-06-20 – 2018-06-21 (×2): 17 g via ORAL
  Filled 2018-06-20 (×2): qty 1

## 2018-06-20 MED ORDER — FENTANYL CITRATE (PF) 100 MCG/2ML IJ SOLN
INTRAMUSCULAR | Status: AC
  Filled 2018-06-20: qty 2

## 2018-06-20 MED ORDER — METHOCARBAMOL 500 MG IVPB - SIMPLE MED
500.0000 mg | Freq: Four times a day (QID) | INTRAVENOUS | Status: DC | PRN
  Administered 2018-06-20 – 2018-06-21 (×2): 500 mg via INTRAVENOUS
  Filled 2018-06-20: qty 50
  Filled 2018-06-20: qty 500

## 2018-06-20 MED ORDER — ONDANSETRON HCL 4 MG/2ML IJ SOLN
4.0000 mg | Freq: Four times a day (QID) | INTRAMUSCULAR | Status: DC | PRN
  Administered 2018-06-20: 4 mg via INTRAVENOUS
  Filled 2018-06-20 (×3): qty 2

## 2018-06-20 MED ORDER — FERROUS SULFATE 325 (65 FE) MG PO TABS: 325.0000 mg | ORAL_TABLET | Freq: Three times a day (TID) | ORAL | 3 refills | Status: DC

## 2018-06-20 MED ORDER — METOCLOPRAMIDE HCL 5 MG/ML IJ SOLN
5.0000 mg | Freq: Three times a day (TID) | INTRAMUSCULAR | Status: DC | PRN
  Administered 2018-06-20: 10 mg via INTRAVENOUS
  Filled 2018-06-20: qty 2

## 2018-06-20 MED ORDER — METOCLOPRAMIDE HCL 5 MG PO TABS: 5.0000 mg | ORAL_TABLET | Freq: Three times a day (TID) | ORAL | Status: DC | PRN

## 2018-06-20 MED ORDER — SODIUM CHLORIDE 0.9 % IR SOLN
Status: DC | PRN
  Administered 2018-06-20: 1000 mL

## 2018-06-20 MED ORDER — ASPIRIN 81 MG PO CHEW: 81.0000 mg | CHEWABLE_TABLET | Freq: Two times a day (BID) | ORAL | 0 refills | Status: AC

## 2018-06-20 MED ORDER — SODIUM CHLORIDE 0.9 % IV SOLN
INTRAVENOUS | Status: DC
  Administered 2018-06-20 (×2): via INTRAVENOUS

## 2018-06-20 MED ORDER — MAGNESIUM CITRATE PO SOLN: 1.0000 | Freq: Once | ORAL | Status: DC | PRN

## 2018-06-20 MED ORDER — ONDANSETRON HCL 4 MG/2ML IJ SOLN
INTRAMUSCULAR | Status: AC
  Filled 2018-06-20: qty 2

## 2018-06-20 MED ORDER — SERTRALINE HCL 50 MG PO TABS
50.0000 mg | ORAL_TABLET | Freq: Every day | ORAL | Status: DC
  Administered 2018-06-21: 50 mg via ORAL
  Filled 2018-06-20: qty 1

## 2018-06-20 MED ORDER — PHENOL 1.4 % MT LIQD
1.0000 | OROMUCOSAL | Status: DC | PRN
  Filled 2018-06-20: qty 177

## 2018-06-20 MED ORDER — METHOCARBAMOL 500 MG IVPB - SIMPLE MED
INTRAVENOUS | Status: AC
  Filled 2018-06-20: qty 50

## 2018-06-20 MED ORDER — DEXAMETHASONE SODIUM PHOSPHATE 10 MG/ML IJ SOLN
INTRAMUSCULAR | Status: AC
  Filled 2018-06-20: qty 1

## 2018-06-20 MED ORDER — METFORMIN HCL 500 MG PO TABS
500.0000 mg | ORAL_TABLET | Freq: Every day | ORAL | Status: DC
  Administered 2018-06-21: 500 mg via ORAL
  Filled 2018-06-20: qty 1

## 2018-06-20 MED ORDER — CHLORHEXIDINE GLUCONATE 4 % EX LIQD: 60.0000 mL | Freq: Once | CUTANEOUS | Status: AC

## 2018-06-20 MED ORDER — BISACODYL 10 MG RE SUPP: 10.0000 mg | Freq: Every day | RECTAL | Status: DC | PRN

## 2018-06-20 MED ORDER — STERILE WATER FOR IRRIGATION IR SOLN
Status: DC | PRN
  Administered 2018-06-20: 2000 mL

## 2018-06-20 MED ORDER — INSULIN ASPART 100 UNIT/ML ~~LOC~~ SOLN
0.0000 [IU] | Freq: Three times a day (TID) | SUBCUTANEOUS | Status: DC
  Administered 2018-06-20: 2 [IU] via SUBCUTANEOUS
  Administered 2018-06-20: 3 [IU] via SUBCUTANEOUS
  Administered 2018-06-21: 2 [IU] via SUBCUTANEOUS

## 2018-06-20 MED ORDER — ASPIRIN 81 MG PO CHEW
81.0000 mg | CHEWABLE_TABLET | Freq: Two times a day (BID) | ORAL | Status: DC
  Administered 2018-06-20 – 2018-06-21 (×2): 81 mg via ORAL
  Filled 2018-06-20 (×2): qty 1

## 2018-06-20 MED ORDER — PROPOFOL 10 MG/ML IV BOLUS
INTRAVENOUS | Status: AC
  Filled 2018-06-20: qty 20

## 2018-06-20 MED ORDER — CEFAZOLIN SODIUM-DEXTROSE 2-4 GM/100ML-% IV SOLN
2.0000 g | Freq: Four times a day (QID) | INTRAVENOUS | Status: AC
  Administered 2018-06-20 (×2): 2 g via INTRAVENOUS
  Filled 2018-06-20 (×2): qty 100

## 2018-06-20 MED ORDER — DOCUSATE SODIUM 100 MG PO CAPS: 100.0000 mg | ORAL_CAPSULE | Freq: Two times a day (BID) | ORAL | 0 refills | Status: DC

## 2018-06-20 MED ORDER — HYDROCODONE-ACETAMINOPHEN 5-325 MG PO TABS
1.0000 | ORAL_TABLET | ORAL | Status: DC | PRN
  Administered 2018-06-20: 2 via ORAL
  Administered 2018-06-20 (×2): 1 via ORAL
  Filled 2018-06-20: qty 1
  Filled 2018-06-20: qty 2
  Filled 2018-06-20: qty 1

## 2018-06-20 MED ORDER — ONDANSETRON HCL 4 MG PO TABS
4.0000 mg | ORAL_TABLET | Freq: Four times a day (QID) | ORAL | Status: DC | PRN
  Administered 2018-06-20: 4 mg via ORAL
  Filled 2018-06-20: qty 1

## 2018-06-20 MED ORDER — CELECOXIB 200 MG PO CAPS
200.0000 mg | ORAL_CAPSULE | Freq: Two times a day (BID) | ORAL | Status: DC
  Administered 2018-06-20 – 2018-06-21 (×2): 200 mg via ORAL
  Filled 2018-06-20 (×2): qty 1

## 2018-06-20 MED ORDER — MIDAZOLAM HCL 2 MG/2ML IJ SOLN
INTRAMUSCULAR | Status: AC
  Filled 2018-06-20: qty 2

## 2018-06-20 MED ORDER — HYDROCODONE-ACETAMINOPHEN 7.5-325 MG PO TABS
1.0000 | ORAL_TABLET | ORAL | Status: DC | PRN
  Administered 2018-06-20 – 2018-06-21 (×3): 2 via ORAL
  Filled 2018-06-20 (×3): qty 2

## 2018-06-20 MED ORDER — TRANEXAMIC ACID 1000 MG/10ML IV SOLN
1000.0000 mg | Freq: Once | INTRAVENOUS | Status: AC
  Administered 2018-06-20: 1000 mg via INTRAVENOUS
  Filled 2018-06-20: qty 10

## 2018-06-20 MED ORDER — BUPIVACAINE IN DEXTROSE 0.75-8.25 % IT SOLN
INTRATHECAL | Status: DC | PRN
  Administered 2018-06-20: 2 mL via INTRATHECAL

## 2018-06-20 MED ORDER — METHOCARBAMOL 500 MG PO TABS: 500.0000 mg | ORAL_TABLET | Freq: Four times a day (QID) | ORAL | 0 refills | Status: DC | PRN

## 2018-06-20 MED ORDER — MENTHOL 3 MG MT LOZG: 1.0000 | LOZENGE | OROMUCOSAL | Status: DC | PRN

## 2018-06-20 MED ORDER — DOCUSATE SODIUM 100 MG PO CAPS
100.0000 mg | ORAL_CAPSULE | Freq: Two times a day (BID) | ORAL | Status: DC
  Administered 2018-06-20 – 2018-06-21 (×2): 100 mg via ORAL
  Filled 2018-06-20 (×2): qty 1

## 2018-06-20 MED ORDER — BUPROPION HCL ER (XL) 150 MG PO TB24
150.0000 mg | ORAL_TABLET | Freq: Every day | ORAL | Status: DC
  Administered 2018-06-21: 150 mg via ORAL
  Filled 2018-06-20: qty 1

## 2018-06-20 MED ORDER — METHOCARBAMOL 500 MG PO TABS
500.0000 mg | ORAL_TABLET | Freq: Four times a day (QID) | ORAL | Status: DC | PRN
  Administered 2018-06-20 – 2018-06-21 (×2): 500 mg via ORAL
  Filled 2018-06-20 (×3): qty 1

## 2018-06-20 MED ORDER — FENTANYL CITRATE (PF) 100 MCG/2ML IJ SOLN
INTRAMUSCULAR | Status: DC | PRN
  Administered 2018-06-20 (×2): 50 ug via INTRAVENOUS

## 2018-06-20 MED ORDER — HYDROCODONE-ACETAMINOPHEN 7.5-325 MG PO TABS: 1.0000 | ORAL_TABLET | ORAL | 0 refills | Status: DC | PRN

## 2018-06-20 MED ORDER — DEXAMETHASONE SODIUM PHOSPHATE 10 MG/ML IJ SOLN
10.0000 mg | Freq: Once | INTRAMUSCULAR | Status: AC
  Administered 2018-06-20: 10 mg via INTRAVENOUS

## 2018-06-20 MED ORDER — SODIUM CHLORIDE 0.9 % IV SOLN
1000.0000 mg | INTRAVENOUS | Status: AC
  Administered 2018-06-20: 1000 mg via INTRAVENOUS

## 2018-06-20 MED ORDER — ALUM & MAG HYDROXIDE-SIMETH 200-200-20 MG/5ML PO SUSP: 15.0000 mL | ORAL | Status: DC | PRN

## 2018-06-20 MED ORDER — DIPHENHYDRAMINE HCL 12.5 MG/5ML PO ELIX: 12.5000 mg | ORAL_SOLUTION | ORAL | Status: DC | PRN

## 2018-06-20 MED ORDER — PROPOFOL 10 MG/ML IV BOLUS
INTRAVENOUS | Status: AC
  Filled 2018-06-20: qty 60

## 2018-06-20 MED ORDER — PROPOFOL 500 MG/50ML IV EMUL
INTRAVENOUS | Status: DC | PRN
  Administered 2018-06-20: 75 ug/kg/min via INTRAVENOUS

## 2018-06-20 MED ORDER — PANTOPRAZOLE SODIUM 40 MG PO TBEC
40.0000 mg | DELAYED_RELEASE_TABLET | Freq: Every day | ORAL | Status: DC
  Administered 2018-06-21: 40 mg via ORAL
  Filled 2018-06-20: qty 1

## 2018-06-20 MED ORDER — MIDAZOLAM HCL 5 MG/5ML IJ SOLN
INTRAMUSCULAR | Status: DC | PRN
  Administered 2018-06-20: 2 mg via INTRAVENOUS

## 2018-06-20 MED ORDER — HYDROMORPHONE HCL 1 MG/ML IJ SOLN
0.2500 mg | INTRAMUSCULAR | Status: DC | PRN
  Administered 2018-06-20: 0.5 mg via INTRAVENOUS
  Administered 2018-06-20 (×2): 0.25 mg via INTRAVENOUS

## 2018-06-20 MED ORDER — FERROUS SULFATE 325 (65 FE) MG PO TABS
325.0000 mg | ORAL_TABLET | Freq: Three times a day (TID) | ORAL | Status: DC
  Administered 2018-06-21: 325 mg via ORAL
  Filled 2018-06-20: qty 1

## 2018-06-20 MED ORDER — CEFAZOLIN SODIUM-DEXTROSE 2-4 GM/100ML-% IV SOLN
INTRAVENOUS | Status: AC
  Filled 2018-06-20: qty 100

## 2018-06-20 MED ORDER — LACTATED RINGERS IV SOLN
INTRAVENOUS | Status: DC
  Administered 2018-06-20: 1000 mL via INTRAVENOUS

## 2018-06-20 MED ORDER — MORPHINE SULFATE (PF) 4 MG/ML IV SOLN
0.5000 mg | INTRAVENOUS | Status: DC | PRN
  Administered 2018-06-20: 1 mg via INTRAVENOUS
  Filled 2018-06-20: qty 1

## 2018-06-20 MED ORDER — CEFAZOLIN SODIUM-DEXTROSE 2-4 GM/100ML-% IV SOLN
2.0000 g | INTRAVENOUS | Status: AC
  Administered 2018-06-20: 2 g via INTRAVENOUS

## 2018-06-20 MED ORDER — DEXAMETHASONE SODIUM PHOSPHATE 10 MG/ML IJ SOLN
10.0000 mg | Freq: Once | INTRAMUSCULAR | Status: AC
  Administered 2018-06-21: 10 mg via INTRAVENOUS
  Filled 2018-06-20: qty 1

## 2018-06-20 MED ORDER — POLYETHYLENE GLYCOL 3350 17 G PO PACK: 17.0000 g | PACK | Freq: Two times a day (BID) | ORAL | 0 refills | Status: DC

## 2018-06-20 MED ORDER — LACTATED RINGERS IV SOLN
INTRAVENOUS | Status: DC | PRN
  Administered 2018-06-20 (×2): via INTRAVENOUS

## 2018-06-20 MED ORDER — INSULIN ASPART 100 UNIT/ML ~~LOC~~ SOLN: 0.0000 [IU] | Freq: Every day | SUBCUTANEOUS | Status: DC

## 2018-06-20 SURGICAL SUPPLY — 34 items
BAG ZIPLOCK 12X15 (MISCELLANEOUS) IMPLANT
BLADE SAG 18X100X1.27 (BLADE) ×3 IMPLANT
CAPT HIP TOTAL 2 ×3 IMPLANT
COVER PERINEAL POST (MISCELLANEOUS) ×3 IMPLANT
COVER SURGICAL LIGHT HANDLE (MISCELLANEOUS) ×3 IMPLANT
DERMABOND ADVANCED (GAUZE/BANDAGES/DRESSINGS) ×2
DERMABOND ADVANCED .7 DNX12 (GAUZE/BANDAGES/DRESSINGS) ×1 IMPLANT
DRAPE STERI IOBAN 125X83 (DRAPES) ×3 IMPLANT
DRAPE U-SHAPE 47X51 STRL (DRAPES) ×6 IMPLANT
DRESSING AQUACEL AG SP 3.5X10 (GAUZE/BANDAGES/DRESSINGS) ×1 IMPLANT
DRSG AQUACEL AG SP 3.5X10 (GAUZE/BANDAGES/DRESSINGS) ×3
DURAPREP 26ML APPLICATOR (WOUND CARE) ×3 IMPLANT
ELECT REM PT RETURN 15FT ADLT (MISCELLANEOUS) ×3 IMPLANT
GLOVE BIOGEL M STRL SZ7.5 (GLOVE) ×6 IMPLANT
GLOVE BIOGEL PI IND STRL 7.0 (GLOVE) ×4 IMPLANT
GLOVE BIOGEL PI IND STRL 7.5 (GLOVE) ×1 IMPLANT
GLOVE BIOGEL PI IND STRL 8.5 (GLOVE) IMPLANT
GLOVE BIOGEL PI INDICATOR 7.0 (GLOVE) ×8
GLOVE BIOGEL PI INDICATOR 7.5 (GLOVE) ×2
GLOVE BIOGEL PI INDICATOR 8.5 (GLOVE)
GLOVE ECLIPSE 8.0 STRL XLNG CF (GLOVE) IMPLANT
GLOVE ORTHO TXT STRL SZ7.5 (GLOVE) ×3 IMPLANT
GOWN STRL REUS W/TWL 2XL LVL3 (GOWN DISPOSABLE) ×3 IMPLANT
GOWN STRL REUS W/TWL LRG LVL3 (GOWN DISPOSABLE) ×9 IMPLANT
HOLDER FOLEY CATH W/STRAP (MISCELLANEOUS) ×3 IMPLANT
PACK ANTERIOR HIP CUSTOM (KITS) ×3 IMPLANT
SUT MNCRL AB 4-0 PS2 18 (SUTURE) ×3 IMPLANT
SUT STRATAFIX 0 PDS 27 VIOLET (SUTURE) ×3
SUT VIC AB 1 CT1 36 (SUTURE) ×9 IMPLANT
SUT VIC AB 2-0 CT1 27 (SUTURE) ×4
SUT VIC AB 2-0 CT1 TAPERPNT 27 (SUTURE) ×2 IMPLANT
SUTURE STRATFX 0 PDS 27 VIOLET (SUTURE) ×1 IMPLANT
TRAY FOLEY CATH 14FR (SET/KITS/TRAYS/PACK) ×3 IMPLANT
YANKAUER SUCT BULB TIP 10FT TU (MISCELLANEOUS) IMPLANT

## 2018-06-20 NOTE — Addendum Note (Signed)
Addendum  created 06/20/18 1206 by Lissa Morales, CRNA   Intraprocedure Blocks edited, Intraprocedure Flowsheets edited, Intraprocedure Meds edited, Sign clinical note

## 2018-06-20 NOTE — Transfer of Care (Signed)
Immediate Anesthesia Transfer of Care Note  Patient: Tracy Pitts  Procedure(s) Performed: LEFT TOTAL HIP ARTHROPLASTY ANTERIOR APPROACH (Left Hip)  Patient Location: PACU  Anesthesia Type:Spinal  Level of Consciousness: awake, alert , oriented and patient cooperative  Airway & Oxygen Therapy: Patient Spontanous Breathing and Patient connected to face mask oxygen  Post-op Assessment: Report given to RN and Post -op Vital signs reviewed and stable  Post vital signs: Reviewed and stable  Last Vitals:  Vitals Value Taken Time  BP 142/109 06/20/2018 10:45 AM  Temp    Pulse 70 06/20/2018 10:45 AM  Resp 13 06/20/2018 10:45 AM  SpO2 100 % 06/20/2018 10:45 AM  Vitals shown include unvalidated device data.  Last Pain:  Vitals:   06/20/18 0645  TempSrc:   PainSc: 0-No pain      Patients Stated Pain Goal: 3 (69/24/93 2419)  Complications: No apparent anesthesia complications

## 2018-06-20 NOTE — Interval H&P Note (Signed)
History and Physical Interval Note:  06/20/2018 6:12 AM  Tracy Pitts  has presented today for surgery, with the diagnosis of Left hip osteoarthritis  The various methods of treatment have been discussed with the patient and family. After consideration of risks, benefits and other options for treatment, the patient has consented to  Procedure(s) with comments: LEFT TOTAL HIP ARTHROPLASTY ANTERIOR APPROACH (Left) - 70 mins as a surgical intervention .  The patient's history has been reviewed, patient examined, no change in status, stable for surgery.  I have reviewed the patient's chart and labs.  Questions were answered to the patient's satisfaction.     Mauri Pole

## 2018-06-20 NOTE — Anesthesia Procedure Notes (Addendum)
Procedure Name: MAC Date/Time: 06/20/2018 8:30 AM Performed by: Lissa Morales, CRNA Pre-anesthesia Checklist: Patient identified, Emergency Drugs available, Suction available, Patient being monitored and Timeout performed Patient Re-evaluated:Patient Re-evaluated prior to induction Oxygen Delivery Method: Simple face mask Placement Confirmation: positive ETCO2

## 2018-06-20 NOTE — Anesthesia Procedure Notes (Signed)
Spinal  Patient location during procedure: OR End time: 06/20/2018 8:42 AM Staffing Resident/CRNA: Lissa Morales, CRNA Performed: anesthesiologist and resident/CRNA  Preanesthetic Checklist Completed: patient identified, site marked, surgical consent, pre-op evaluation, timeout performed, IV checked, risks and benefits discussed and monitors and equipment checked Spinal Block Patient position: sitting Prep: DuraPrep Patient monitoring: heart rate, continuous pulse ox and blood pressure Approach: midline Location: L4-5 Injection technique: single-shot Needle Needle type: Pencan  Needle gauge: 24 G Needle length: 9 cm Additional Notes Expiration date of kit checked and confirmed. Patient tolerated procedure well, without complications.

## 2018-06-20 NOTE — Op Note (Signed)
NAME:  Tracy Pitts                ACCOUNT NO.: [id number]      MEDICAL RECORD NO.: [id number]      FACILITY:  [facility]      PHYSICIAN:  Mauri Pole  DATE OF BIRTH:  11/22/1961     DATE OF PROCEDURE:  06/20/2018                                 OPERATIVE REPORT         PREOPERATIVE DIAGNOSIS: Left  hip osteoarthritis.      POSTOPERATIVE DIAGNOSIS:  Left hip osteoarthritis.      PROCEDURE:  Left total hip replacement through an anterior approach   utilizing DePuy THR system, component size 84mm pinnacle cup, a size 32+4 neutral   Altrex liner, a size 1 Hi Tri Lock stem with a 32+1 delta ceramic   ball.      SURGEON:  Pietro Cassis. Alvan Dame, M.D.      ASSISTANT:  Nehemiah Massed, PA-C     ANESTHESIA:  Spinal.      SPECIMENS:  None.      COMPLICATIONS:  None.      BLOOD LOSS:  350 cc     DRAINS:  None.      INDICATION OF THE PROCEDURE:  Tracy Pitts is a 57 y.o. female who had   presented to office for evaluation of left hip pain.  Radiographs revealed   progressive degenerative changes with bone-on-bone   articulation of the  hip joint, including subchondral cystic changes and osteophytes.  The patient had painful limited range of   motion significantly affecting their overall quality of life and function.  The patient was failing to    respond to conservative measures including medications and/or injections and activity modification and at this point was ready   to proceed with more definitive measures.  Consent was obtained for   benefit of pain relief.  Specific risks of infection, DVT, component   failure, dislocation, neurovascular injury, and need for revision surgery were reviewed in the office as well discussion of   the anterior versus posterior approach were reviewed.     PROCEDURE IN DETAIL:  The patient was brought to operative theater.   Once adequate anesthesia, preoperative antibiotics, 2 gm of Ancef, 1 gm of Tranexamic Acid, and 10  mg of Decadron were administered, the patient was positioned supine on the [employer] table.  Once the patient was safely positioned with adequate padding of boney prominences we predraped out the hip, and used fluoroscopy to confirm orientation of the pelvis.      The left hip was then prepped and draped from proximal iliac crest to   mid thigh with a shower curtain technique.      Time-out was performed identifying the patient, planned procedure, and the appropriate extremity.     An incision was then made 2 cm lateral to the   anterior superior iliac spine extending over the orientation of the   tensor fascia lata muscle and sharp dissection was carried down to the   fascia of the muscle.      The fascia was then incised.  The muscle belly was identified and swept   laterally and retractor placed along the superior neck.  Following   cauterization of the circumflex vessels and removing some  pericapsular   fat, a second cobra retractor was placed on the inferior neck.  A T-capsulotomy was made along the line of the   superior neck to the trochanteric fossa, then extended proximally and   distally.  Tag sutures were placed and the retractors were then placed   intracapsular.  We then identified the trochanteric fossa and   orientation of my neck cut and then made a neck osteotomy with the femur on traction.  The femoral   head was removed without difficulty or complication.  Traction was let   off and retractors were placed posterior and anterior around the   acetabulum.      The labrum and foveal tissue were debrided.  I began reaming with a 44 mm   reamer and reamed up to 49 mm reamer with good bony bed preparation and a 50 mm  cup was chosen.  The final 50 mm Pinnacle cup was then impacted under fluoroscopy to confirm the depth of penetration and orientation with respect to   Abduction and forward flexion.  A screw was placed into the ilium followed by the hole eliminator.  The final    32+4 neutral Altrex liner was impacted with good visualized rim fit.  The cup was positioned anatomically within the acetabular portion of the pelvis.      At this point, the femur was rolled to 100 degrees.  Further capsule was   released off the inferior aspect of the femoral neck.  I then   released the superior capsule proximally.  With the leg in a neutral position the hook was placed laterally   along the femur under the vastus lateralis origin and elevated manually and then held in position using the hook attachment on the bed.  The leg was then extended and adducted with the leg rolled to 100   degrees of external rotation.  Retractors were placed along the medial calcar and posteriorly over the greater trochanter.  Once the proximal femur was fully   exposed, I used a box osteotome to set orientation.  I then began   broaching with the starting chili pepper broach and passed this by hand and then broached up to 1.  With the 1 broach in place I chose a high offset neck and did several trial reductions.  The offset was appropriate, leg lengths   appeared to be equal best matched with the +1 head ball trial confirmed radiographically.   Given these findings, I went ahead and dislocated the hip, repositioned all   retractors and positioned the right hip in the extended and abducted position.  The final 1 Hi Tri Lock stem was   chosen and it was impacted down to the level of neck cut.  Based on this   and the trial reductions, a final 32+1 delta ceramic ball was chosen and   impacted onto a clean and dry trunnion, and the hip was reduced.  The   hip had been irrigated throughout the case again at this point.  I did   reapproximate the superior capsular leaflet to the anterior leaflet   using #1 Vicryl.  The fascia of the   tensor fascia lata muscle was then reapproximated using #1 Vicryl and #0 Stratafix sutures.  The   remaining wound was closed with 2-0 Vicryl and running 4-0 Monocryl.    The hip was cleaned, dried, and dressed sterilely using Dermabond and   Aquacel dressing.  The patient was then brought  to recovery room in stable condition tolerating the procedure well.    Nehemiah Massed, PA-C was present for the entirety of the case involved from   preoperative positioning, perioperative retractor management, general   facilitation of the case, as well as primary wound closure as assistant.            Pietro Cassis Alvan Dame, M.D.        06/20/2018 10:10 AM

## 2018-06-20 NOTE — Anesthesia Postprocedure Evaluation (Signed)
Anesthesia Post Note  Patient: Tracy Pitts  Procedure(s) Performed: LEFT TOTAL HIP ARTHROPLASTY ANTERIOR APPROACH (Left Hip)     Patient location during evaluation: PACU Anesthesia Type: Spinal Level of consciousness: oriented and awake and alert Pain management: pain level controlled Vital Signs Assessment: post-procedure vital signs reviewed and stable Respiratory status: spontaneous breathing, respiratory function stable and patient connected to nasal cannula oxygen Cardiovascular status: blood pressure returned to baseline and stable Postop Assessment: no headache, no backache, no apparent nausea or vomiting, spinal receding and patient able to bend at knees Anesthetic complications: no    Last Vitals:  Vitals:   06/20/18 1115 06/20/18 1130  BP: 114/65 108/68  Pulse: (!) 58 71  Resp: 10 11  Temp:  36.4 C  SpO2: 100% 96%    Last Pain:  Vitals:   06/20/18 1130  TempSrc:   PainSc: Asleep                 Leeana Creer,W. EDMOND

## 2018-06-20 NOTE — Evaluation (Signed)
Physical Therapy Evaluation Patient Details Name: Tracy Pitts MRN: [id number] DOB: [birth year] Today's Date: 06/20/2018   History of Present Illness  57 yo female s/p L THA-direct anterior 06/20/18.   Clinical Impression  On eval POD 0, pt required Min assist for mobility. She was only able to walk ~15 feet before c/o feeling "hot", nauseous. Pt vomited during session. Moderate pain with activity. Will follow and progress activity as tolerated. Plan is for d/c home with HEP.     Follow Up Recommendations Follow surgeon's recommendation for DC plan and follow-up therapies    Equipment Recommendations  None recommended by PT    Recommendations for Other Services       Precautions / Restrictions Precautions Precautions: Fall Restrictions Weight Bearing Restrictions: No Other Position/Activity Restrictions: WBAT      Mobility  Bed Mobility Overal bed mobility: Needs Assistance Bed Mobility: Supine to Sit     Supine to sit: Min assist     General bed mobility comments: Assist for L LE.   Transfers Overall transfer level: Needs assistance Equipment used: Rolling walker (2 wheeled) Transfers: Sit to/from Stand Sit to Stand: Min assist         General transfer comment: VCs safety, technique, hand placement  Ambulation/Gait Ambulation/Gait assistance: Min assist Gait Distance (Feet): 15 Feet Assistive device: Rolling walker (2 wheeled) Gait Pattern/deviations: Step-to pattern     General Gait Details: VCs safety, sequence. Assist to stabilize. Distance limited by nausea/vomiting/feeling "hot".   Stairs            Wheelchair Mobility    Modified Rankin (Stroke Patients Only)       Balance Overall balance assessment: Mild deficits observed, not formally tested                                           Pertinent Vitals/Pain Pain Assessment: 0-10 Pain Score: 6  Pain Location: L hip Pain Descriptors / Indicators:  Discomfort;Grimacing;Sore;Aching Pain Intervention(s): Monitored during session;Repositioned    Home Living Family/patient expects to be discharged to:: Private residence Living Arrangements: Spouse/significant other   Type of Home: House Home Access: Stairs to enter Entrance Stairs-Rails: Psychiatric nurse of Steps: front-4 Home Layout: Two level;Able to live on main level with bedroom/bathroom Home Equipment: Gilford Rile - 2 wheels;Cane - single point      Prior Function Level of Independence: Independent               Hand Dominance        Extremity/Trunk Assessment   Upper Extremity Assessment Upper Extremity Assessment: Overall WFL for tasks assessed    Lower Extremity Assessment Lower Extremity Assessment: Generalized weakness(s/p L THA)    Cervical / Trunk Assessment Cervical / Trunk Assessment: Normal  Communication   Communication: No difficulties  Cognition Arousal/Alertness: Awake/alert Behavior During Therapy: WFL for tasks assessed/performed Overall Cognitive Status: Within Functional Limits for tasks assessed                                        General Comments      Exercises     Assessment/Plan    PT Assessment Patient needs continued PT services  PT Problem List Decreased strength;Decreased range of motion;Decreased balance;Decreased mobility;Decreased activity tolerance;Pain;Decreased knowledge of use of DME  PT Treatment Interventions DME instruction;Gait training;Functional mobility training;Therapeutic activities;Patient/family education;Stair training;Therapeutic exercise    PT Goals (Current goals can be found in the Care Plan section)  Acute Rehab PT Goals Patient Stated Goal: home. regain independence/plof PT Goal Formulation: With patient Time For Goal Achievement: 07/04/18 Potential to Achieve Goals: Good    Frequency 7X/week   Barriers to discharge        Co-evaluation                AM-PAC PT "6 Clicks" Daily Activity  Outcome Measure Difficulty turning over in bed (including adjusting bedclothes, sheets and blankets)?: A Little Difficulty moving from lying on back to sitting on the side of the bed? : Unable Difficulty sitting down on and standing up from a chair with arms (e.g., wheelchair, bedside commode, etc,.)?: Unable Help needed moving to and from a bed to chair (including a wheelchair)?: A Little Help needed walking in hospital room?: A Little Help needed climbing 3-5 steps with a railing? : A Lot 6 Click Score: 13    End of Session Equipment Utilized During Treatment: Gait belt Activity Tolerance: Patient limited by pain(limited by N/V) Patient left: in chair;with call bell/phone within reach;with family/visitor present   PT Visit Diagnosis: Other abnormalities of gait and mobility (R26.89);Pain;Difficulty in walking, not elsewhere classified (R26.2) Pain - Right/Left: Left Pain - part of body: Hip    Time: 5329-9242 PT Time Calculation (min) (ACUTE ONLY): 9 min   Charges:   PT Evaluation $PT Eval Low Complexity: 1 Low            Weston Anna, MPT Pager: [phone]

## 2018-06-20 NOTE — Discharge Instructions (Signed)

## 2018-06-21 ENCOUNTER — Encounter (HOSPITAL_COMMUNITY): Payer: Self-pay | Admitting: Orthopedic Surgery

## 2018-06-21 LAB — CBC
HCT: 37.1 % (ref 36.0–46.0)
Hemoglobin: 12.2 g/dL (ref 12.0–15.0)
MCH: 31.2 pg (ref 26.0–34.0)
MCHC: 32.9 g/dL (ref 30.0–36.0)
MCV: 94.9 fL (ref 78.0–100.0)
Platelets: 237 10*3/uL (ref 150–400)
RBC: 3.91 MIL/uL (ref 3.87–5.11)
RDW: 12.8 % (ref 11.5–15.5)
WBC: 10.8 10*3/uL — ABNORMAL HIGH (ref 4.0–10.5)

## 2018-06-21 LAB — BASIC METABOLIC PANEL
Anion gap: 7 (ref 5–15)
BUN: 9 mg/dL (ref 6–20)
CALCIUM: 8.7 mg/dL — AB (ref 8.9–10.3)
CO2: 28 mmol/L (ref 22–32)
Chloride: 108 mmol/L (ref 98–111)
Creatinine, Ser: 0.58 mg/dL (ref 0.44–1.00)
GFR calc Af Amer: >60 mL/min (ref >60)
GLUCOSE: 110 mg/dL — AB (ref 70–99)
Potassium: 3.9 mmol/L (ref 3.5–5.1)
Sodium: 143 mmol/L (ref 135–145)

## 2018-06-21 LAB — GLUCOSE, CAPILLARY: Glucose-Capillary: 121 mg/dL — ABNORMAL HIGH (ref 70–99)

## 2018-06-21 MED ORDER — ONDANSETRON HCL 4 MG PO TABS: 4.0000 mg | ORAL_TABLET | Freq: Four times a day (QID) | ORAL | 0 refills | Status: DC | PRN

## 2018-06-21 MED FILL — METHOCARBAMOL 500 MG TABLET: 500 | 10 days supply | Qty: 40 | Fill #0

## 2018-06-21 MED FILL — ONDANSETRON HCL 4 MG TABLET: 4 | 8 days supply | Qty: 30 | Fill #0

## 2018-06-21 MED FILL — HYDROCODON-APAP 7.5-325: 7.5-325 | 5 days supply | Qty: 60 | Fill #0

## 2018-06-21 MED FILL — ASPIRIN LOW DOSE 81 MG CHEW: 81 | 30 days supply | Qty: 60 | Fill #0

## 2018-06-21 NOTE — Progress Notes (Signed)
     Subjective: 1 Day Post-Op Procedure(s) (LRB): LEFT TOTAL HIP ARTHROPLASTY ANTERIOR APPROACH (Left)   Patient reports pain as mild, pain controlled well with medications.  No events throughout the night.  Patient is complaining of some nausea this morning and requests medication for home.  Patient is ready for discharge home, if they do well with therapy.   Objective:   VITALS:   Vitals:   06/21/18 0113 06/21/18 0513  BP: 108/69 127/68  Pulse: 87 75  Resp: 18 18  Temp: 98.1 F (36.7 C) 98 F (36.7 C)  SpO2: 93% 96%    Dorsiflexion/Plantar flexion intact Incision: dressing C/D/I No cellulitis present Compartment soft  LABS Recent Labs    06/21/18 0537  HGB 12.2  HCT 37.1  WBC 10.8*  PLT 237    Recent Labs    06/21/18 0537  NA 143  K 3.9  BUN 9  CREATININE 0.58  GLUCOSE 110*     Assessment/Plan: 1 Day Post-Op Procedure(s) (LRB): LEFT TOTAL HIP ARTHROPLASTY ANTERIOR APPROACH (Left) Foley cath d/c'ed Advance diet Up with therapy D/C IV fluids Discharge home Follow up in 2 weeks at [facility] ([place]). Follow up with OLIN,Laronica Bhagat D in 2 weeks.  Contact information:  [facility]) [address] [place] [phone]    Obese (BMI 30-39.9) Estimated body mass index is 37.77 kg/m as calculated from the following:   Height as of this encounter: 5\' 6"  (1.676 m).   Weight as of this encounter: 106.1 kg (234 lb). Patient also counseled that weight may inhibit the healing process Patient counseled that losing weight will help with future health issues          West Pugh. Marge Vandermeulen   PAC  06/21/2018, 8:09 AM

## 2018-06-21 NOTE — Discharge Summary (Signed)
Physician Discharge Summary  Patient ID: Tracy Pitts MRN: [id number] DOB/AGE: 27-Dec-1960 57 y.o.  Admit date: 06/20/2018 Discharge date: 06/21/2018   Procedures:  Procedure(s) (LRB): LEFT TOTAL HIP ARTHROPLASTY ANTERIOR APPROACH (Left)  Attending Physician:  Dr. Paralee Cancel   Admission Diagnoses:   Left hip primary OA / pain  Discharge Diagnoses:  Principal Problem:   S/P left THA, AA  Past Medical History:  Diagnosis Date  . Allergy   . Anxiety   . Arthritis    LEFT HIP   . Complication of anesthesia    SOME NAUSEA WITH PRIOR C SECTION  . Depression    Dr Mickle Plumb  . GERD (gastroesophageal reflux disease)   . Prediabetes   . Wears glasses     HPI:    Tracy Pitts, 57 y.o. female, has a history of pain and functional disability in the left hip(s) due to arthritis and patient has failed non-surgical conservative treatments for greater than 12 weeks to include NSAID's and/or analgesics, use of assistive devices and activity modification.  Onset of symptoms was gradual starting <1 years ago with rapidlly worsening course since that time.The patient noted no past surgery on the left hip(s).  Patient currently rates pain in the left hip at 8 out of 10 with activity. Patient has night pain, worsening of pain with activity and weight bearing, trendelenberg gait, pain that interfers with activities of daily living and pain with passive range of motion. Patient has evidence of periarticular osteophytes and joint space narrowing by imaging studies. This condition presents safety issues increasing the risk of falls. There is no current active infection.  Risks, benefits and expectations were discussed with the patient.  Risks including but not limited to the risk of anesthesia, blood clots, nerve damage, blood vessel damage, failure of the prosthesis, infection and up to and including death.  Patient understand the risks, benefits and expectations and wishes to proceed with surgery.     PCP: Midge Minium, MD   Discharged Condition: good  Hospital Course:  Patient underwent the above stated procedure on 06/20/2018. Patient tolerated the procedure well and brought to the recovery room in good condition and subsequently to the floor.  POD #1 BP: 127/68 ; Pulse: 75 ; Temp: 98 F (36.7 C) ; Resp: 18 Patient reports pain as mild, pain controlled well with medications.  No events throughout the night.  Patient is complaining of some nausea this morning and requests medication for home.  Patient is ready for discharge home. Dorsiflexion/plantar flexion intact, incision: dressing C/D/I, no cellulitis present and compartment soft.   LABS  Basename    HGB     12.2  HCT     37.1    Discharge Exam: General appearance: alert, cooperative and no distress Extremities: Homans sign is negative, no sign of DVT, no edema, redness or tenderness in the calves or thighs and no ulcers, gangrene or trophic changes  Disposition: Home with follow up in 2 weeks   Follow-up Information    Paralee Cancel, MD. Schedule an appointment as soon as possible for a visit in 2 weeks.   Specialty:  Orthopedic Surgery Contact information: [address] [place] [address] [phone]        Schedule an appointment as soon as possible for a visit in 2 weeks to follow up.           Discharge Instructions    Call MD / Call 911   Complete by:  As directed    If you experience chest pain or shortness of breath, CALL 911 and be transported to the hospital emergency room.  If you develope a fever above 101 F, pus (white drainage) or increased drainage or redness at the wound, or calf pain, call your surgeon's office.   Change dressing   Complete by:  As directed    Maintain surgical dressing until follow up in the clinic. If the edges start to pull up, may reinforce with tape. If the dressing is no longer working, may remove and cover with gauze and tape, but must keep  the area dry and clean.  Call with any questions or concerns.   Constipation Prevention   Complete by:  As directed    Drink plenty of fluids.  Prune juice may be helpful.  You may use a stool softener, such as Colace (over the counter) 100 mg twice a day.  Use MiraLax (over the counter) for constipation as needed.   Diet - low sodium heart healthy   Complete by:  As directed    Discharge instructions   Complete by:  As directed    Maintain surgical dressing until follow up in the clinic. If the edges start to pull up, may reinforce with tape. If the dressing is no longer working, may remove and cover with gauze and tape, but must keep the area dry and clean.  Follow up in 2 weeks at [facility]. Call with any questions or concerns.   Increase activity slowly as tolerated   Complete by:  As directed    Weight bearing as tolerated with assist device (walker, cane, etc) as directed, use it as long as suggested by your surgeon or therapist, typically at least 4-6 weeks.   TED hose   Complete by:  As directed    Use stockings (TED hose) for 2 weeks on both leg(s).  You may remove them at night for sleeping.      Allergies as of 06/21/2018      Reactions   Percocet [oxycodone-acetaminophen] Itching   Vicodin [hydrocodone-acetaminophen] Itching      Medication List    STOP taking these medications   aspirin EC 81 MG tablet Replaced by:  aspirin 81 MG chewable tablet   ibuprofen 200 MG tablet Commonly known as:  ADVIL,MOTRIN     TAKE these medications   aspirin 81 MG chewable tablet Commonly known as:  ASPIRIN CHILDRENS Chew 1 tablet (81 mg total) by mouth 2 (two) times daily. Take for 4 weeks, then resume regular dose. Replaces:  aspirin EC 81 MG tablet   buPROPion 150 MG 24 hr tablet Commonly known as:  WELLBUTRIN XL Take 1 tablet (150 mg total) by mouth daily.   docusate sodium 100 MG capsule Commonly known as:  COLACE Take 1 capsule (100 mg total) by mouth 2  (two) times daily.   ferrous sulfate 325 (65 FE) MG tablet Commonly known as:  FERROUSUL Take 1 tablet (325 mg total) by mouth 3 (three) times daily with meals.   HYDROcodone-acetaminophen 7.5-325 MG tablet Commonly known as:  NORCO Take 1-2 tablets by mouth every 4 (four) hours as needed for moderate pain.   metFORMIN 500 MG tablet Commonly known as:  GLUCOPHAGE Take 1 tablet (500 mg total) by mouth daily with breakfast.   methocarbamol 500 MG tablet Commonly known as:  ROBAXIN Take 1 tablet (500 mg total) by mouth every 6 (six) hours as needed for muscle spasms.   ondansetron  4 MG tablet Commonly known as:  ZOFRAN Take 1 tablet (4 mg total) by mouth every 6 (six) hours as needed for nausea.   pantoprazole 40 MG tablet Commonly known as:  PROTONIX Take 1 tablet (40 mg total) by mouth daily.   polyethylene glycol packet Commonly known as:  MIRALAX / GLYCOLAX Take 17 g by mouth 2 (two) times daily.   Red Yeast Rice 600 MG Caps Take 600 mg by mouth daily.   sertraline 50 MG tablet Commonly known as:  ZOLOFT Take 1 tablet (50 mg total) by mouth daily.   vitamin C 1000 MG tablet Take 1,000 mg by mouth daily.   Vitamin D3 2000 units Tabs Take 6,000 Units by mouth daily.   vitamin E 400 UNIT capsule Take 400 Units by mouth daily.            Discharge Care Instructions  (From admission, onward)        Start     Ordered   06/21/18 0000  Change dressing    Comments:  Maintain surgical dressing until follow up in the clinic. If the edges start to pull up, may reinforce with tape. If the dressing is no longer working, may remove and cover with gauze and tape, but must keep the area dry and clean.  Call with any questions or concerns.   06/21/18 1916       Signed: West Pugh. Lavaeh Bau   PA-C  06/21/2018, 2:02 PM

## 2018-06-21 NOTE — Progress Notes (Signed)
Physical Therapy Treatment Patient Details Name: Tracy Pitts MRN: [id number] DOB: 29-Jun-1961 Today's Date: 06/21/2018    History of Present Illness 56 yo female s/p L THA-direct anterior 06/20/18.     PT Comments    Progressing with mobility. Reviewed exercises, gait training, and stair training. Issued HEP for pt to perform 2x/day. All education completed. Okay to d/c from PT standpoint.    Follow Up Recommendations  Follow surgeon's recommendation for DC plan and follow-up therapies     Equipment Recommendations  None recommended by PT    Recommendations for Other Services       Precautions / Restrictions Precautions Precautions: Fall Restrictions Weight Bearing Restrictions: No Other Position/Activity Restrictions: WBAT    Mobility  Bed Mobility Overal bed mobility: Needs Assistance Bed Mobility: Supine to Sit     Supine to sit: Supervision     General bed mobility comments: for safety.   Transfers Overall transfer level: Needs assistance Equipment used: Rolling walker (2 wheeled) Transfers: Sit to/from Stand Sit to Stand: Min guard         General transfer comment: close guard for safety. VCs safety hand placement  Ambulation/Gait Ambulation/Gait assistance: Min guard Gait Distance (Feet): 115 Feet Assistive device: Rolling walker (2 wheeled) Gait Pattern/deviations: Step-to pattern;Step-through pattern;Decreased stride length     General Gait Details: VCs safety, sequence. Pt slowly transitioned to reciprocal gait pattern. Pt denied nausea/lightheadedness   Stairs Stairs: Yes Stairs assistance: Min guard Stair Management: Step to pattern;Sideways;One rail Left Number of Stairs: 2 General stair comments: up and over portable steps. VCs safety, sequence, technique. Close guard for safety. Husband present to observe   Wheelchair Mobility    Modified Rankin (Stroke Patients Only)       Balance                                             Cognition Arousal/Alertness: Awake/alert Behavior During Therapy: WFL for tasks assessed/performed Overall Cognitive Status: Within Functional Limits for tasks assessed                                        Exercises Total Joint Exercises Ankle Circles/Pumps: AROM;Both;10 reps;Supine Quad Sets: AROM;Both;10 reps;Supine Heel Slides: AROM;Left;10 reps;Supine Hip ABduction/ADduction: AAROM;AROM;Left;10 reps;Supine Long Arc Quad: AROM;Left;10 reps;Seated Knee Flexion: AROM;Left;10 reps;Standing Marching in Standing: AROM;Both;10 reps;Standing General Exercises - Lower Extremity Heel Raises: AROM;Both;10 reps;Standing    General Comments        Pertinent Vitals/Pain Pain Assessment: 0-10 Pain Score: 5  Pain Location: L hip Pain Descriptors / Indicators: Sore;Tightness Pain Intervention(s): Monitored during session;Repositioned;Ice applied    Home Living                      Prior Function            PT Goals (current goals can now be found in the care plan section) Progress towards PT goals: Progressing toward goals    Frequency    7X/week      PT Plan Current plan remains appropriate    Co-evaluation              AM-PAC PT "6 Clicks" Daily Activity  Outcome Measure  Difficulty turning over in bed (including adjusting bedclothes, sheets and blankets)?: A Little  Difficulty moving from lying on back to sitting on the side of the bed? : A Little Difficulty sitting down on and standing up from a chair with arms (e.g., wheelchair, bedside commode, etc,.)?: A Little Help needed moving to and from a bed to chair (including a wheelchair)?: A Little Help needed walking in hospital room?: A Little Help needed climbing 3-5 steps with a railing? : A Little 6 Click Score: 18    End of Session Equipment Utilized During Treatment: Gait belt Activity Tolerance: Patient tolerated treatment well Patient left: in  chair;with call bell/phone within reach;with family/visitor present   PT Visit Diagnosis: Other abnormalities of gait and mobility (R26.89);Pain Pain - Right/Left: Left Pain - part of body: Hip     Time: 4920-1007 PT Time Calculation (min) (ACUTE ONLY): 26 min  Charges:  $Gait Training: 8-22 mins $Therapeutic Exercise: 8-22 mins                        Weston Anna, MPT Pager: [phone]

## 2018-06-21 NOTE — Progress Notes (Signed)
Vitals stable overnight. Pain controlled. Foley catheter removed. No nausea overnight.

## 2018-06-21 NOTE — Progress Notes (Signed)
Patient discharged to home with husband. Given all belongings, instructions, prescriptions. Patient and husband verbalized understanding of all instructions. Escorted to pov via w/c.

## 2018-06-23 ENCOUNTER — Other Ambulatory Visit: Payer: Self-pay | Admitting: *Deleted

## 2018-06-23 NOTE — Patient Outreach (Signed)
[facility]) Care Management  06/23/2018  Tracy Pitts 1961/09/09 [id number]   Subjective: Telephone call to patient's home number times 2 , no answer, line busy, and unable to leave a message.     Objective: Per KPN (Knowledge Performance Now, point of care tool) and chart review, patient hospitalized  06/20/18 - 06/21/18 for  Left  hip osteoarthritis, status post LEFT TOTAL HIP ARTHROPLASTY ANTERIOR APPROACH on 06/20/18.   Patient also has a history of hyperlipidemia and prediabetes.      Assessment: Received Focus Plan Preoperative / Transition of care referral on 05/31/18.  Preoperative call completed, and Transition of care follow up pending patient contact.        Plan: RNCM will send unsuccessful outreach  letter, [facility] pamphlet, will call patient for 2nd telephone outreach attempt, transition of care follow up, and proceed with case closure, within 10 business days if no return call.       Trudy Kory H. Annia Friendly, BSN, [place] Management [facility] Telephonic CM Phone: [phone] Fax: [phone]

## 2018-06-26 ENCOUNTER — Other Ambulatory Visit: Payer: Self-pay | Admitting: *Deleted

## 2018-06-26 ENCOUNTER — Encounter: Payer: Self-pay | Admitting: *Deleted

## 2018-06-26 NOTE — Patient Outreach (Signed)
[facility]) Care Management  06/26/2018  Tracy Pitts [birth year] [id number]   Subjective: Telephone call to patient's home / mobile number, spoke with patient, and HIPAA verified.  Discussed [facility] Care Management Focus Plan Transition of care follow up, patient voiced understanding, and is in agreement to follow up.  Patient she is feeling okay, still a little achy, performing home exercise program without difficulty, and has a follow up appointment with surgeon on 07/05/18.   Patient states she is able to manage self care and has assistance as needed with activities of daily living / home management.  Patient voices understanding of medical diagnosis, surgery, and treatment plan. [facility] benefits discussed on 06/01/18  preoperative call and patient states no additional questions at this time. Patient states she does not have any education material, transition of care, care coordination, disease management, disease monitoring, transportation, community resource, or pharmacy needs at this time.  States she is very appreciative of the follow up and is in agreement to receive [place] Management information.     Objective:Per KPN (Knowledge Performance Now, point of care tool) and chart review,patient hospitalized  06/20/18 - 06/21/18 forLefthip osteoarthritis, status post LEFT TOTAL HIP ARTHROPLASTY ANTERIOR APPROACH on 06/20/18. Patient also has a history of hyperlipidemia and prediabetes.      Assessment: Received Focus Plan Preoperative / Transition of care referral on 05/31/18.Preoperative call completed, and Transition of care follow up completed, no care management needs, and will proceed with case closure.      Plan:RNCM will send patient successful outreach letter, [facility] pamphlet, and magnet. RNCM will complete case closure due to follow up completed / no care management needs.       Ron Junco H. Annia Friendly, BSN, [place] Management [facility]  Telephonic CM Phone: [phone] Fax: [phone]

## 2018-06-28 ENCOUNTER — Encounter (HOSPITAL_BASED_OUTPATIENT_CLINIC_OR_DEPARTMENT_OTHER): Payer: No Typology Code available for payment source

## 2018-07-05 MED FILL — SHIPPING COST: 1 days supply | Qty: 1 | Fill #6

## 2018-07-05 MED FILL — METHOCARBAMOL 500 MG TABLET: 500 | 15 days supply | Qty: 60 | Fill #0

## 2018-07-20 MED FILL — buPROPion HCL ER (XL) 150 M: 150 | 90 days supply | Qty: 90 | Fill #1

## 2018-07-20 MED FILL — SHIPPING COST: 1 days supply | Qty: 1 | Fill #7

## 2018-09-04 MED FILL — AMOXICILLIN 500 MG CAPSULE: 500 | 5 days supply | Qty: 20 | Fill #0

## 2018-09-04 MED FILL — SHIPPING COST: 1 days supply | Qty: 1 | Fill #8

## 2018-09-06 ENCOUNTER — Other Ambulatory Visit: Payer: Self-pay | Admitting: Family Medicine

## 2018-09-06 MED FILL — METHOCARBAMOL 500 MG TABLET: 500 | 15 days supply | Qty: 60 | Fill #1

## 2018-09-06 MED FILL — SERTRALINE HCL 50 MG TABLET: 50 | 90 days supply | Qty: 90 | Fill #0

## 2018-09-06 MED FILL — SHIPPING COST: 1 days supply | Qty: 1 | Fill #9

## 2018-09-06 MED FILL — PANTOPRAZOLE SOD DR 40 MG T: 40 | 90 days supply | Qty: 90 | Fill #0

## 2018-09-06 MED FILL — metFORMIN HCL 500 MG TABS: 500 | 90 days supply | Qty: 180 | Fill #0

## 2018-09-07 ENCOUNTER — Encounter (HOSPITAL_BASED_OUTPATIENT_CLINIC_OR_DEPARTMENT_OTHER): Payer: No Typology Code available for payment source

## 2018-09-18 ENCOUNTER — Encounter: Payer: Self-pay | Admitting: General Practice

## 2018-09-18 ENCOUNTER — Other Ambulatory Visit: Payer: Self-pay

## 2018-09-18 ENCOUNTER — Ambulatory Visit (INDEPENDENT_AMBULATORY_CARE_PROVIDER_SITE_OTHER): Payer: No Typology Code available for payment source | Admitting: Family Medicine

## 2018-09-18 ENCOUNTER — Encounter: Payer: Self-pay | Admitting: Family Medicine

## 2018-09-18 VITALS — BP 123/80 | HR 74 | Temp 98.1°F | Resp 17 | Ht 66.0 in | Wt 235.4 lb

## 2018-09-18 DIAGNOSIS — R7303 Prediabetes: Secondary | ICD-10-CM

## 2018-09-18 DIAGNOSIS — E785 Hyperlipidemia, unspecified: Secondary | ICD-10-CM | POA: Diagnosis not present

## 2018-09-18 LAB — HEPATIC FUNCTION PANEL
ALT: 21 U/L (ref 0–35)
AST: 14 U/L (ref 0–37)
Albumin: 4.2 g/dL (ref 3.5–5.2)
Alkaline Phosphatase: 66 U/L (ref 39–117)
BILIRUBIN TOTAL: 0.3 mg/dL (ref 0.2–1.2)
Bilirubin, Direct: 0.1 mg/dL (ref 0.0–0.3)
Total Protein: 6.9 g/dL (ref 6.0–8.3)

## 2018-09-18 LAB — LIPID PANEL
CHOL/HDL RATIO: 3
Cholesterol: 208 mg/dL — ABNORMAL HIGH (ref 0–200)
HDL: 61.7 mg/dL (ref >39.00)
LDL CALC: 127 mg/dL — AB (ref 0–99)
NonHDL: 146.34
TRIGLYCERIDES: 96 mg/dL (ref 0.0–149.0)
VLDL: 19.2 mg/dL (ref 0.0–40.0)

## 2018-09-18 LAB — BASIC METABOLIC PANEL
BUN: 19 mg/dL (ref 6–23)
CALCIUM: 9.2 mg/dL (ref 8.4–10.5)
CO2: 27 mEq/L (ref 19–32)
Chloride: 106 mEq/L (ref 96–112)
Creatinine, Ser: 0.79 mg/dL (ref 0.40–1.20)
GFR: 79.82 mL/min (ref >60.00)
Glucose, Bld: 106 mg/dL — ABNORMAL HIGH (ref 70–99)
Potassium: 4.2 mEq/L (ref 3.5–5.1)
Sodium: 141 mEq/L (ref 135–145)

## 2018-09-18 LAB — TSH: TSH: 1.96 u[IU]/mL (ref 0.35–4.50)

## 2018-09-18 LAB — HEMOGLOBIN A1C: Hgb A1c MFr Bld: 5.7 % (ref 4.6–6.5)

## 2018-09-18 NOTE — Assessment & Plan Note (Signed)
Ongoing issue for pt.  Was started on Metformin by GYN in 2015 for A1C of 6.1 due to hx of gestational DM.  Pt has never reached an A1C of 6.5  Tolerating Metfomin w/o difficulty.  Check labs.  Adjust meds prn

## 2018-09-18 NOTE — Progress Notes (Signed)
   Subjective:    Patient ID: Tracy Pitts, female    DOB: February 06, 1961, 57 y.o.   MRN: [id number]  HPI Hyperlipidemia- Pt's last LDL 152.  Started on Red Yeast Rice at last visit.  Pt's weight is stable.  No recent exercise due to hip replacement but prior to that was walking 3+ days/week.  Denies abd pain, N/V.  Pre-diabetes- pt was started on Metformin 500mg  daily by previous provider.  A1C has never officially reached diabetic range.  Denies CP, SOB, HAs, visual changes, edema.   Review of Systems For ROS see HPI     Objective:   Physical Exam  Constitutional: She is oriented to person, place, and time. She appears well-developed and well-nourished. No distress.  HENT:  Head: Normocephalic and atraumatic.  Eyes: Pupils are equal, round, and reactive to light. Conjunctivae and EOM are normal.  Neck: Normal range of motion. Neck supple. No thyromegaly present.  Cardiovascular: Normal rate, regular rhythm, normal heart sounds and intact distal pulses.  No murmur heard. Pulmonary/Chest: Effort normal and breath sounds normal. No respiratory distress.  Abdominal: Soft. She exhibits no distension. There is no tenderness.  Musculoskeletal: She exhibits no edema.  Lymphadenopathy:    She has no cervical adenopathy.  Neurological: She is alert and oriented to person, place, and time.  Skin: Skin is warm and dry.  Psychiatric: She has a normal mood and affect. Her behavior is normal.  Vitals reviewed.         Assessment & Plan:

## 2018-09-18 NOTE — Assessment & Plan Note (Signed)
Chronic problem.  Currently trying to control w/ diet, exercise, and Red Yeast Rice.  Check labs.  Adjust meds prn

## 2018-09-18 NOTE — Patient Instructions (Signed)
Schedule your complete physical in 6 months We'll notify you of your lab results and make any changes if needed Continue to work on healthy diet and regular exercise- you look great! Call with any questions or concerns Happy Fall!!!

## 2018-10-12 ENCOUNTER — Other Ambulatory Visit: Payer: Self-pay | Admitting: Family Medicine

## 2018-10-12 MED FILL — buPROPion HCL ER (XL) 150 M: 150 | 90 days supply | Qty: 90 | Fill #0

## 2018-10-12 MED FILL — SHIPPING COST: 1 days supply | Qty: 1 | Fill #10

## 2018-12-01 MED FILL — PANTOPRAZOLE SOD DR 40 MG T: 40 | 90 days supply | Qty: 90 | Fill #1

## 2018-12-01 MED FILL — SERTRALINE HCL 50 MG TABLET: 50 | 90 days supply | Qty: 90 | Fill #1

## 2018-12-01 MED FILL — SHIPPING COST: 1 days supply | Qty: 1 | Fill #11

## 2018-12-19 ENCOUNTER — Other Ambulatory Visit: Payer: Self-pay | Admitting: Family Medicine

## 2018-12-19 ENCOUNTER — Encounter: Payer: Self-pay | Admitting: Family Medicine

## 2018-12-19 DIAGNOSIS — Z0100 Encounter for examination of eyes and vision without abnormal findings: Secondary | ICD-10-CM

## 2018-12-19 DIAGNOSIS — Z01419 Encounter for gynecological examination (general) (routine) without abnormal findings: Secondary | ICD-10-CM

## 2018-12-19 DIAGNOSIS — Z1283 Encounter for screening for malignant neoplasm of skin: Secondary | ICD-10-CM

## 2019-01-25 MED FILL — SHIPPING COST: 1 days supply | Qty: 1 | Fill #0

## 2019-01-25 MED FILL — buPROPion HCL ER (XL) 150 M: 150 | 90 days supply | Qty: 90 | Fill #1

## 2019-02-22 ENCOUNTER — Other Ambulatory Visit: Payer: Self-pay | Admitting: Family Medicine

## 2019-02-22 MED FILL — metFORMIN HCL 500 MG TABS: 500 | 90 days supply | Qty: 180 | Fill #1

## 2019-02-22 MED FILL — PANTOPRAZOLE SOD DR 40 MG T: 40 | 90 days supply | Qty: 90 | Fill #0

## 2019-02-22 MED FILL — SERTRALINE HCL 50 MG TABLET: 50 | 90 days supply | Qty: 90 | Fill #0

## 2019-03-23 ENCOUNTER — Encounter: Payer: No Typology Code available for payment source | Admitting: Family Medicine

## 2019-04-10 ENCOUNTER — Other Ambulatory Visit: Payer: Self-pay | Admitting: Family Medicine

## 2019-04-10 MED FILL — buPROPion HCL ER (XL) 150 M: 150 | 90 days supply | Qty: 90 | Fill #0

## 2019-05-17 ENCOUNTER — Encounter: Payer: No Typology Code available for payment source | Admitting: Family Medicine

## 2019-05-23 ENCOUNTER — Encounter: Payer: No Typology Code available for payment source | Admitting: Family Medicine

## 2019-05-29 LAB — HM MAMMOGRAPHY: HM Mammogram: NORMAL (ref 0–4)

## 2019-05-29 LAB — HM PAP SMEAR

## 2019-06-01 ENCOUNTER — Encounter: Payer: Self-pay | Admitting: General Practice

## 2019-06-01 ENCOUNTER — Encounter: Payer: Self-pay | Admitting: Family Medicine

## 2019-06-01 ENCOUNTER — Ambulatory Visit (INDEPENDENT_AMBULATORY_CARE_PROVIDER_SITE_OTHER): Payer: No Typology Code available for payment source | Admitting: Family Medicine

## 2019-06-01 ENCOUNTER — Other Ambulatory Visit: Payer: Self-pay

## 2019-06-01 VITALS — BP 118/80 | HR 78 | Temp 97.9°F | Resp 16 | Ht 66.0 in | Wt 201.2 lb

## 2019-06-01 DIAGNOSIS — Z0000 Encounter for general adult medical examination without abnormal findings: Secondary | ICD-10-CM

## 2019-06-01 DIAGNOSIS — E559 Vitamin D deficiency, unspecified: Secondary | ICD-10-CM | POA: Diagnosis not present

## 2019-06-01 DIAGNOSIS — R7303 Prediabetes: Secondary | ICD-10-CM | POA: Diagnosis not present

## 2019-06-01 DIAGNOSIS — E669 Obesity, unspecified: Secondary | ICD-10-CM

## 2019-06-01 LAB — HEPATIC FUNCTION PANEL
ALT: 15 U/L (ref 0–35)
AST: 12 U/L (ref 0–37)
Albumin: 4.7 g/dL (ref 3.5–5.2)
Alkaline Phosphatase: 62 U/L (ref 39–117)
Bilirubin, Direct: 0.1 mg/dL (ref 0.0–0.3)
Total Bilirubin: 0.4 mg/dL (ref 0.2–1.2)
Total Protein: 7.1 g/dL (ref 6.0–8.3)

## 2019-06-01 LAB — LIPID PANEL
Cholesterol: 229 mg/dL — ABNORMAL HIGH (ref 0–200)
HDL: 67.3 mg/dL (ref >39.00)
LDL Cholesterol: 145 mg/dL — ABNORMAL HIGH (ref 0–99)
NonHDL: 161.49
Total CHOL/HDL Ratio: 3
Triglycerides: 80 mg/dL (ref 0.0–149.0)
VLDL: 16 mg/dL (ref 0.0–40.0)

## 2019-06-01 LAB — BASIC METABOLIC PANEL
BUN: 25 mg/dL — ABNORMAL HIGH (ref 6–23)
CO2: 29 mEq/L (ref 19–32)
Calcium: 9.5 mg/dL (ref 8.4–10.5)
Chloride: 104 mEq/L (ref 96–112)
Creatinine, Ser: 0.83 mg/dL (ref 0.40–1.20)
GFR: 70.77 mL/min (ref >60.00)
Glucose, Bld: 84 mg/dL (ref 70–99)
Potassium: 4.6 mEq/L (ref 3.5–5.1)
Sodium: 140 mEq/L (ref 135–145)

## 2019-06-01 LAB — CBC WITH DIFFERENTIAL/PLATELET
Basophils Absolute: 0 10*3/uL (ref 0.0–0.1)
Basophils Relative: 0.9 % (ref 0.0–3.0)
Eosinophils Absolute: 0.1 10*3/uL (ref 0.0–0.7)
Eosinophils Relative: 1.4 % (ref 0.0–5.0)
HCT: 45.2 % (ref 36.0–46.0)
Hemoglobin: 14.8 g/dL (ref 12.0–15.0)
Lymphocytes Relative: 25.4 % (ref 12.0–46.0)
Lymphs Abs: 1.4 10*3/uL (ref 0.7–4.0)
MCHC: 32.9 g/dL (ref 30.0–36.0)
MCV: 94.7 fl (ref 78.0–100.0)
Monocytes Absolute: 0.4 10*3/uL (ref 0.1–1.0)
Monocytes Relative: 7 % (ref 3.0–12.0)
Neutro Abs: 3.7 10*3/uL (ref 1.4–7.7)
Neutrophils Relative %: 65.3 % (ref 43.0–77.0)
Platelets: 256 10*3/uL (ref 150.0–400.0)
RBC: 4.77 Mil/uL (ref 3.87–5.11)
RDW: 13.3 % (ref 11.5–15.5)
WBC: 5.6 10*3/uL (ref 4.0–10.5)

## 2019-06-01 LAB — VITAMIN D 25 HYDROXY (VIT D DEFICIENCY, FRACTURES): VITD: 33.22 ng/mL (ref 30.00–100.00)

## 2019-06-01 LAB — HEMOGLOBIN A1C: Hgb A1c MFr Bld: 5.5 % (ref 4.6–6.5)

## 2019-06-01 LAB — TSH: TSH: 1.97 u[IU]/mL (ref 0.35–4.50)

## 2019-06-01 MED ORDER — SERTRALINE HCL 100 MG PO TABS: 100.0000 mg | ORAL_TABLET | Freq: Every day | ORAL | 3 refills | Status: DC

## 2019-06-01 MED FILL — SERTRALINE HCL 100 MG TAB: 100 | 30 days supply | Qty: 30 | Fill #0

## 2019-06-01 NOTE — Assessment & Plan Note (Signed)
Improving.  Pt has lost 35 lbs!  Check labs to risk stratify.  Will follow.

## 2019-06-01 NOTE — Patient Instructions (Addendum)
Follow up in 4-6 weeks to recheck anxiety We'll notify you of your lab results and make any changes if needed Increase the Sertraline to 100mg  daily- 2 of what you have at home and 1 of the new prescription Keep up the good work!  You look fantastic!!! Call with any questions or concerns Stay Safe!!!

## 2019-06-01 NOTE — Progress Notes (Signed)
   Subjective:    Patient ID: Tracy Pitts, female    DOB: 10/05/1961, 58 y.o.   MRN: [id number]  HPI CPE- UTD on pap, mammo, colonoscopy, immunizations.  Pt is down 35 lbs since last visit!  Doing [employer].   Review of Systems Patient reports no vision/ hearing changes, adenopathy,fever, weight change,  persistant/recurrent hoarseness , swallowing issues, chest pain, palpitations, edema, persistant/recurrent cough, hemoptysis, dyspnea (rest/exertional/paroxysmal nocturnal), gastrointestinal bleeding (melena, rectal bleeding), abdominal pain, significant heartburn, bowel changes, GU symptoms (dysuria, hematuria, incontinence), Gyn symptoms (abnormal  bleeding, pain),  syncope, focal weakness, memory loss, numbness & tingling, skin/hair/nail changes, abnormal bruising or bleeding.  + anxiety- work is very stressful     Objective:   Physical Exam General Appearance:    Alert, cooperative, no distress, appears stated age  Head:    Normocephalic, without obvious abnormality, atraumatic  Eyes:    PERRL, conjunctiva/corneas clear, EOM's intact, fundi    benign, both eyes  Ears:    Normal TM's and external ear canals, both ears  Nose:   Nares normal, septum midline, mucosa normal, no drainage    or sinus tenderness  Throat:   Lips, mucosa, and tongue normal; teeth and gums normal  Neck:   Supple, symmetrical, trachea midline, no adenopathy;    Thyroid: no enlargement/tenderness/nodules  Back:     Symmetric, no curvature, ROM normal, no CVA tenderness  Lungs:     Clear to auscultation bilaterally, respirations unlabored  Chest Wall:    No tenderness or deformity   Heart:    Regular rate and rhythm, S1 and S2 normal, no murmur, rub   or gallop  Breast Exam:    Deferred to GYN  Abdomen:     Soft, non-tender, bowel sounds active all four quadrants,    no masses, no organomegaly  Genitalia:    Deferred to GYN  Rectal:    Extremities:   Extremities normal, atraumatic, no cyanosis or edema   Pulses:   2+ and symmetric all extremities  Skin:   Skin color, texture, turgor normal, no rashes or lesions  Lymph nodes:   Cervical, supraclavicular, and axillary nodes normal  Neurologic:   CNII-XII intact, normal strength, sensation and reflexes    throughout          Assessment & Plan:

## 2019-06-01 NOTE — Assessment & Plan Note (Signed)
Pt has hx of this.  Check labs and replete prn. 

## 2019-06-01 NOTE — Assessment & Plan Note (Signed)
Pt has lost 35 lbs!  Applauded her efforts.  Check A1C and determine if Metformin is still needed

## 2019-06-01 NOTE — Assessment & Plan Note (Signed)
Pt's PE WNL.  UTD on pap, mammo, colonoscopy, immunizations.  Check labs.  Anticipatory guidance provided.  

## 2019-06-04 ENCOUNTER — Other Ambulatory Visit: Payer: Self-pay | Admitting: General Practice

## 2019-06-04 ENCOUNTER — Encounter: Payer: Self-pay | Admitting: Family Medicine

## 2019-06-04 DIAGNOSIS — E785 Hyperlipidemia, unspecified: Secondary | ICD-10-CM

## 2019-06-04 MED ORDER — ATORVASTATIN CALCIUM 10 MG PO TABS: 10.0000 mg | ORAL_TABLET | Freq: Every day | ORAL | 0 refills | Status: DC

## 2019-06-04 MED FILL — ATORVASTATIN 10 MG TABLET: 10 | 90 days supply | Qty: 90 | Fill #0

## 2019-06-07 MED FILL — PANTOPRAZOLE SOD DR 40 MG T: 40 | 90 days supply | Qty: 90 | Fill #1

## 2019-07-02 ENCOUNTER — Other Ambulatory Visit: Payer: Self-pay

## 2019-07-02 ENCOUNTER — Ambulatory Visit (INDEPENDENT_AMBULATORY_CARE_PROVIDER_SITE_OTHER): Payer: No Typology Code available for payment source | Admitting: Family Medicine

## 2019-07-02 ENCOUNTER — Encounter: Payer: Self-pay | Admitting: Family Medicine

## 2019-07-02 VITALS — BP 124/72 | HR 78 | Temp 98.7°F | Ht 66.0 in | Wt 199.4 lb

## 2019-07-02 DIAGNOSIS — F329 Major depressive disorder, single episode, unspecified: Secondary | ICD-10-CM

## 2019-07-02 DIAGNOSIS — F419 Anxiety disorder, unspecified: Secondary | ICD-10-CM

## 2019-07-02 MED ORDER — SERTRALINE HCL 100 MG PO TABS: 100.0000 mg | ORAL_TABLET | Freq: Every day | ORAL | 1 refills | Status: DC

## 2019-07-02 MED FILL — SERTRALINE HCL 100 MG TAB: 100 | 90 days supply | Qty: 90 | Fill #0

## 2019-07-02 NOTE — Progress Notes (Signed)
I have discussed the procedure for the virtual visit with the patient who has given consent to proceed with assessment and treatment.   Pt states that she thinks the increase in sertraline is working. She does not feel as anxious. Denies any depression. Is requesting medications to be filled for #90. Medication was pended  Davis Gourd, CMA

## 2019-07-02 NOTE — Progress Notes (Signed)
   Virtual Visit via Video   I connected with patient on 07/02/19 at  2:00 PM EDT by a video enabled telemedicine application and verified that I am speaking with the correct person using two identifiers.  Location patient: Home Location provider: Acupuncturist, Office Persons participating in the virtual visit: Patient, Provider, [place] (Jess B)  I discussed the limitations of evaluation and management by telemedicine and the availability of in person appointments. The patient expressed understanding and agreed to proceed.  Subjective:   HPI:   Anxiety- ongoing issue for pt. At last visit Sertraline was increased to 100mg  daily.  Also on Wellbutrin 150mg  daily.  Pt feels that things are going well, 'it's making a difference'.  Pt reports anxiety is better, 'i'm not feeling as anxious as I was'.  Sleeping well at night.  Less on edge, less tense, less irritable.    ROS:   See pertinent positives and negatives per HPI.  Patient Active Problem List   Diagnosis Date Noted  . S/P left THA, AA 06/20/2018  . Heart palpitations 05/09/2018  . Excessive daytime sleepiness 05/09/2018  . Obesity (BMI 30-39.9) 05/09/2018  . Physical exam 03/21/2018  . Vitamin D deficiency 03/21/2018  . Cholelithiasis with cholecystitis 02/13/2015  . Hyperlipidemia 12/27/2014  . Pre-diabetes 12/25/2014  . Anxiety and depression 12/25/2014    Social History   Tobacco Use  . Smoking status: Never Smoker  . Smokeless tobacco: Never Used  Substance Use Topics  . Alcohol use: Yes    Comment: occasional    Current Outpatient Medications:  .  Ascorbic Acid (VITAMIN C) 1000 MG tablet, Take 1,000 mg by mouth daily., Disp: , Rfl:  .  atorvastatin (LIPITOR) 10 MG tablet, Take 1 tablet (10 mg total) by mouth daily., Disp: 90 tablet, Rfl: 0 .  buPROPion (WELLBUTRIN XL) 150 MG 24 hr tablet, TAKE 1 TABLET BY MOUTH ONCE DAILY, Disp: 90 tablet, Rfl: 1 .  Cholecalciferol (VITAMIN D3) 2000 units TABS, Take 6,000  Units by mouth daily., Disp: , Rfl:  .  metFORMIN (GLUCOPHAGE) 500 MG tablet, Take 1 tablet (500 mg total) by mouth daily with breakfast., Disp: 90 tablet, Rfl: 1 .  pantoprazole (PROTONIX) 40 MG tablet, TAKE 1 TABLET BY MOUTH ONCE DAILY, Disp: 90 tablet, Rfl: 1 .  Red Yeast Rice 600 MG CAPS, Take 600 mg by mouth daily., Disp: , Rfl:  .  sertraline (ZOLOFT) 100 MG tablet, Take 1 tablet (100 mg total) by mouth daily., Disp: 90 tablet, Rfl: 1 .  vitamin E 400 UNIT capsule, Take 400 Units by mouth daily., Disp: , Rfl:   No Active Allergies  Objective:   BP 124/72   Pulse 78   Temp 98.7 F (37.1 C) (Oral)   Ht 5\' 6"  (1.676 m)   Wt 199 lb 6 oz (90.4 kg)   LMP 10/07/2011   BMI 32.18 kg/m   AAOx3, NAD NCAT, EOMI No obvious CN deficits Coloring WNL Pt is able to speak clearly, coherently without shortness of breath or increased work of breathing.  Thought process is linear.  Mood is appropriate.   Assessment and Plan:   Anxiety- improved w/ increased dose of Sertraline.  Continue at 100mg  daily and continue current dose of Wellbutrin.  Will continue to follow.   Annye Asa, MD 07/02/2019

## 2019-07-04 ENCOUNTER — Telehealth: Payer: Self-pay | Admitting: Family Medicine

## 2019-07-04 NOTE — Telephone Encounter (Signed)
LM asking pt to call back to sched. A 6th reck Cholesterol with pt

## 2019-07-30 MED FILL — buPROPion HCL ER (XL) 150 M: 150 | 90 days supply | Qty: 90 | Fill #1

## 2019-08-17 ENCOUNTER — Other Ambulatory Visit: Payer: Self-pay | Admitting: Physician Assistant

## 2019-08-17 ENCOUNTER — Encounter: Payer: Self-pay | Admitting: Physician Assistant

## 2019-08-17 ENCOUNTER — Telehealth: Payer: No Typology Code available for payment source | Admitting: Physician Assistant

## 2019-08-17 DIAGNOSIS — H02842 Edema of right lower eyelid: Secondary | ICD-10-CM

## 2019-08-17 DIAGNOSIS — H0289 Other specified disorders of eyelid: Secondary | ICD-10-CM

## 2019-08-17 MED ORDER — NEOMYCIN-POLYMYXIN-HC 3.5-10000-1 OP SUSP: 2.0000 [drp] | OPHTHALMIC | 0 refills | Status: DC

## 2019-08-17 NOTE — Progress Notes (Signed)
We are sorry that you are not feeling well. Here is how we plan to help!  Based on what you have shared with me it looks like you have a stye.  A stye is an inflammation of the eyelid.  It is often a red, painful lump near the edge of the eyelid that may look like a boil or a pimple.  A stye develops when an infection occurs at the base of an eyelash.    We have made appropriate suggestions for you based upon your presentation: Your symptoms may indicate an infection of the sclera.  The use of anti-inflammatory and antibiotic eye drops for a week will help resolve this condition.  I have sent in neomycin-polymyxin HC opthalmic suspension, two to three drops in the affected eye every 4 hours.  If your symptoms do not improve over the next two to three days you should be seen in your doctor's office.   Ms. Maudie Mercury,  When the stye is forming, it be better seen if you invert the eyelid. Itching of the eye can be secondary to insect bite, and such an event occurred, pleased follow up with your doctor. If you develop any visual changes, pain with eye movement, or if you develop any new symptoms, follow up with your doctor for further evaluation.   HOME CARE:   Wash your hands often!  Let the stye open on its own. Don't squeeze or open it.  Don't rub your eyes. This can irritate your eyes and let in bacteria.  If you need to touch your eyes, wash your hands first.  Don't wear eye makeup or contact lenses until the area has healed.  GET HELP RIGHT AWAY IF:   Your symptoms do not improve.  You develop blurred or loss of vision.  Your symptoms worsen (increased discharge, pain or redness).  Thank you for choosing an e-visit.  Your e-visit answers were reviewed by a board certified advanced clinical practitioner to complete your personal care plan.  Depending upon the condition, your plan could have included both over the counter or prescription medications.  Please review your pharmacy choice.   Make sure the pharmacy is open so you can pick up prescription now.  If there is a problem, you may contact your provider through [employer] and have the prescription routed to another pharmacy.    Your safety is important to [place].  If you have drug allergies check your prescription carefully.  For the next 24 hours you can use MyChart to ask questions about today's visit, request a non-urgent call back, or ask for a work or school excuse.  You will get an email in the next two days asking about your experience.  I hope you that your e-visit has been valuable and will speed your recovery.   I spent 5-10 minutes on review and completion of this note- Lacy Duverney [facility]

## 2019-08-18 MED ORDER — ERYTHROMYCIN 5 MG/GM OP OINT: 1.0000 "application " | TOPICAL_OINTMENT | Freq: Every day | OPHTHALMIC | 0 refills | Status: DC

## 2019-08-18 NOTE — Progress Notes (Signed)
Spoke with patient on phone. Told her that [facility] pharmacy will be closed until Monday. After reviewing questionnaire and talking with patient we agreed to try erythromycin oint rx and that was sent to [facility]. Meds ordered this encounter  Medications  . neomycin-polymyxin-hydrocortisone (CORTISPORIN) 3.5-10000-1 ophthalmic suspension    Sig: Place 2 drops into the right eye every 4 (four) hours.    Dispense:  7.5 mL    Refill:  0    Order Specific Question:   Supervising Provider    Answer:   MILLER, BRIAN [[id number]]  . erythromycin ophthalmic ointment    Sig: Place 1 application into the right eye at bedtime.    Dispense:  3.5 g    Refill:  0    Order Specific Question:   Supervising Provider    Answer:   Noemi Chapel [[id number]]

## 2019-08-18 NOTE — Addendum Note (Signed)
Addended by: Chevis Pretty on: 08/18/2019 06:40 PM   Modules accepted: Orders

## 2019-08-21 ENCOUNTER — Other Ambulatory Visit: Payer: Self-pay | Admitting: Physician Assistant

## 2019-08-22 MED FILL — AMOXICILLIN 500 MG CAPSULE: 500 | 5 days supply | Qty: 20 | Fill #1

## 2019-09-04 ENCOUNTER — Other Ambulatory Visit: Payer: Self-pay | Admitting: Family Medicine

## 2019-09-04 MED FILL — ATORVASTATIN 10 MG TABLET: 10 | 90 days supply | Qty: 90 | Fill #0

## 2019-09-07 ENCOUNTER — Other Ambulatory Visit: Payer: Self-pay | Admitting: Family Medicine

## 2019-09-07 MED FILL — PANTOPRAZOLE SOD DR 40 MG T: 40 | 90 days supply | Qty: 90 | Fill #0

## 2019-09-22 ENCOUNTER — Telehealth: Payer: No Typology Code available for payment source | Admitting: Nurse Practitioner

## 2019-09-22 DIAGNOSIS — B029 Zoster without complications: Secondary | ICD-10-CM

## 2019-09-22 MED ORDER — VALACYCLOVIR HCL 1 G PO TABS: 1000.0000 mg | ORAL_TABLET | Freq: Two times a day (BID) | ORAL | 0 refills | Status: DC

## 2019-09-22 MED ORDER — GABAPENTIN 300 MG PO CAPS: 300.0000 mg | ORAL_CAPSULE | Freq: Two times a day (BID) | ORAL | 0 refills | Status: DC

## 2019-09-22 NOTE — Progress Notes (Signed)
E-visit for Shingles   We are sorry that you are not feeling well. Here is how we plan to help!  Based on what you shared with me it looks like you have shingles.  Shingles or herpes zoster, is a common infection of the nerves.  It is a painful rash caused by the herpes zoster virus.  This is the same virus that causes chickenpox.  After a person has chickenpox, the virus remains inactive in the nerve cells.  Years later, the virus can become active again and travel to the skin.  It typically will appear on one side of the face or body.  Burning or shooting pain, tingling, or itching are early signs of the infection.  Blisters typically scab over in 7 to 10 days and clear up within 2-4 weeks. Shingles is only contagious to people that have never had the chickenpox, the chickenpox vaccine, or anyone who has a compromised immune system.  You should avoid contact with these type of people until your blisters scab over.  I have prescribed Valacyclovir 1g three times daily for 7 days and also Gabapentin 300mg twice daily as needed for pain   HOME CARE: Apply ice packs (wrapped in a thin towel), cool compresses, or soak in cool bath to help reduce pain. Use calamine lotion to calm itchy skin. Avoid scratching the rash. Avoid direct sunlight.  GET HELP RIGHT AWAY IF: Symptoms that don't away after treatment. A rash or blisters near your eye. Increased drainage, fever, or rash after treatment. Severe pain that doesn't go away.   MAKE SURE YOU   Understand these instructions. Will watch your condition. Will get help right away if you are not doing well or get worse.  Thank you for choosing an e-visit.  Your e-visit answers were reviewed by a board certified advanced clinical practitioner to complete your personal care plan. Depending upon the condition, your plan could have included both over the counter or prescription medications.  Please review your pharmacy choice. Make sure the pharmacy  is open so you can pick up prescription now. If there is a problem, you may contact your provider through MyChart messaging and have the prescription routed to another pharmacy.  Your safety is important to us. If you have drug allergies check your prescription carefully.   For the next 24 hours you can use MyChart to ask questions about today's visit, request a non-urgent call back, or ask for a work or school excuse. You will get an email in the next two days asking about your experience. I hope that your e-visit has been valuable and will speed your recovery.  5-10 minutes spent reviewing and documenting in chart.  

## 2019-09-24 ENCOUNTER — Encounter: Payer: Self-pay | Admitting: Family Medicine

## 2019-09-24 MED ORDER — PREDNISONE 10 MG PO TABS: ORAL_TABLET | ORAL | 0 refills | Status: DC

## 2019-09-24 MED FILL — predniSONE 10 MG TABS: 10 | 9 days supply | Qty: 18 | Fill #0

## 2019-10-15 ENCOUNTER — Other Ambulatory Visit: Payer: Self-pay | Admitting: Family Medicine

## 2019-10-15 MED FILL — metFORMIN HCL 500 MG TABS: 500 | 90 days supply | Qty: 180 | Fill #0

## 2019-10-15 MED FILL — buPROPion HCL ER (XL) 150 M: 150 | 30 days supply | Qty: 30 | Fill #0

## 2019-10-15 MED FILL — SERTRALINE HCL 100 MG TAB: 100 | 90 days supply | Qty: 90 | Fill #1

## 2019-11-19 MED FILL — buPROPion HCL ER (XL) 150 M: 150 | 30 days supply | Qty: 30 | Fill #1

## 2019-11-26 IMAGING — DX DG PORTABLE PELVIS
1 series · 1 of 1 positions shown · non-contrast
Comparison: 06/20/2018

CLINICAL DATA: Postop total left hip replacement

EXAM:
PORTABLE PELVIS 1-2 VIEWS

[pelvis ap]
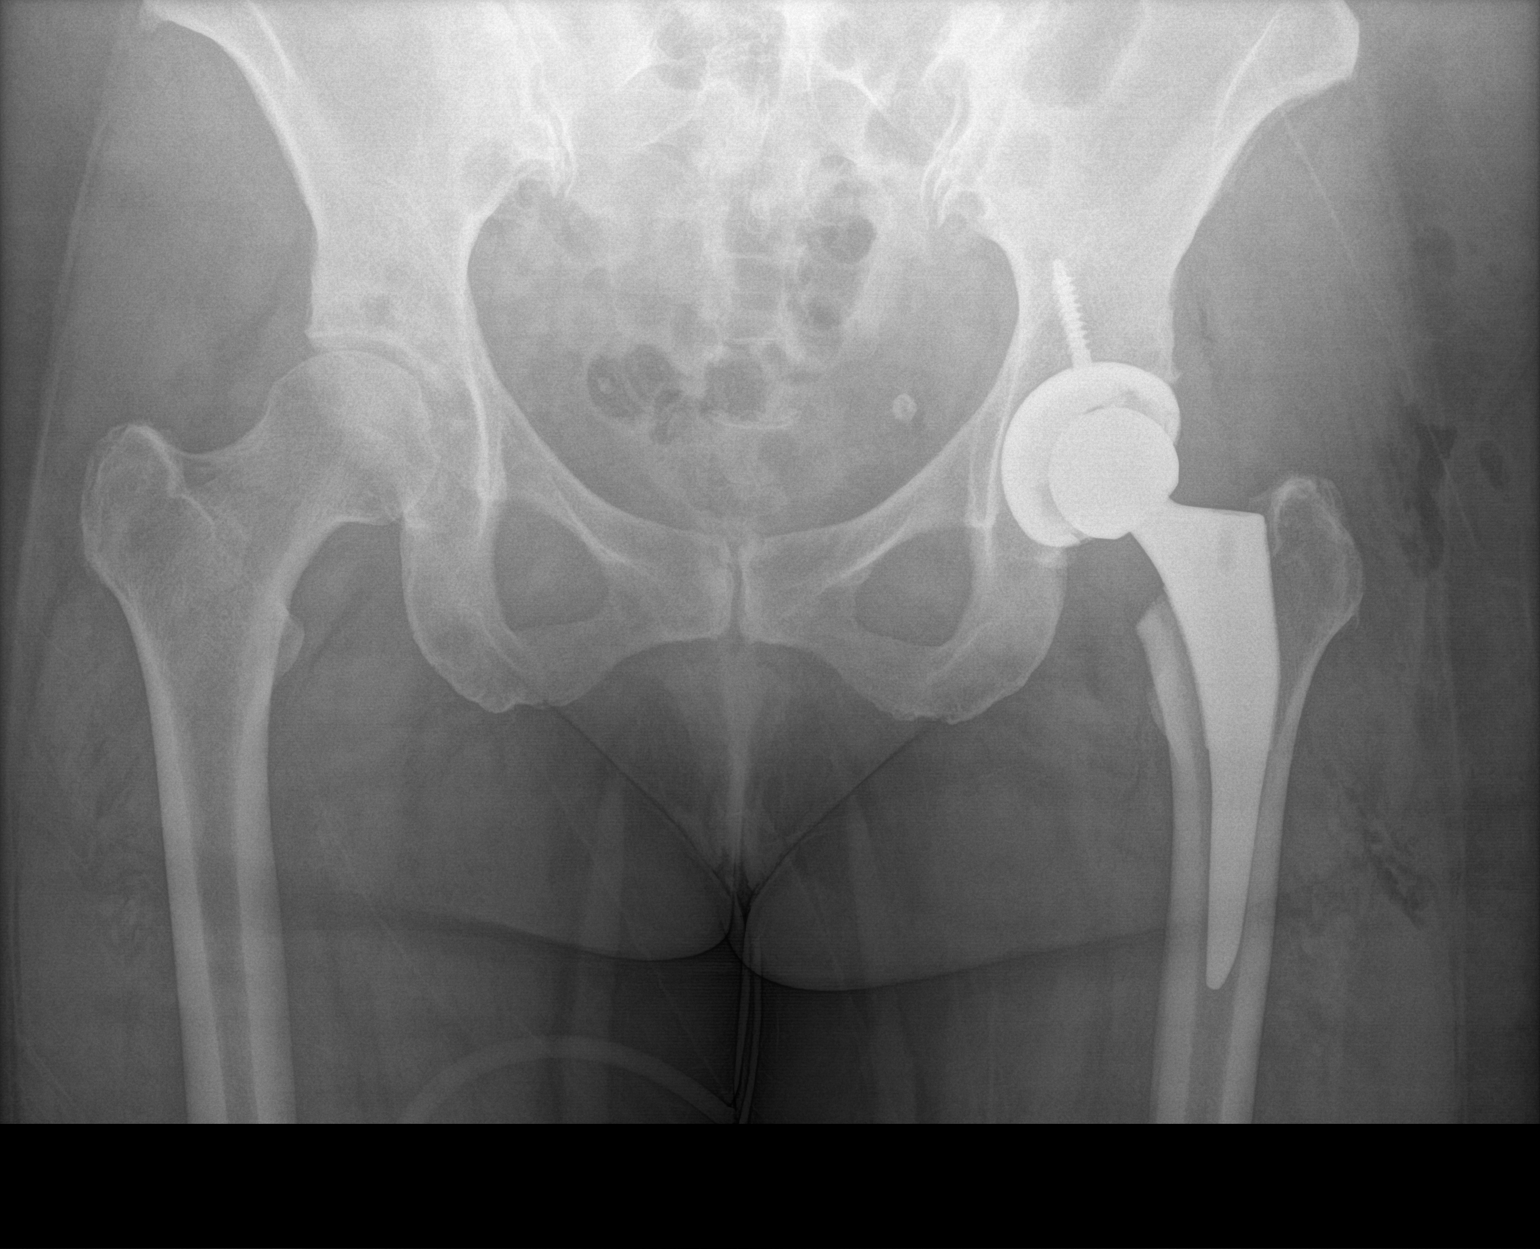

[1 of 1 positions shown; findings below may reference images not displayed]

FINDINGS: Changes of left hip replacement. Normal AP alignment. No hardware
bony complicating feature noted.
IMPRESSION: Left hip replacement.  No complicating feature.

## 2019-11-28 ENCOUNTER — Other Ambulatory Visit: Payer: Self-pay | Admitting: Family Medicine

## 2019-11-28 MED FILL — ATORVASTATIN 10 MG TABLET: 10 | 90 days supply | Qty: 90 | Fill #0

## 2019-12-14 MED FILL — PANTOPRAZOLE SOD DR 40 MG T: 40 | 90 days supply | Qty: 90 | Fill #1

## 2019-12-14 MED FILL — buPROPion HCL ER (XL) 150 M: 150 | 30 days supply | Qty: 30 | Fill #2

## 2020-01-03 MED FILL — SERTRALINE HCL 100 MG TAB: 100 | 30 days supply | Qty: 30 | Fill #1

## 2020-01-28 MED FILL — SERTRALINE HCL 100 MG TAB: 100 | 30 days supply | Qty: 30 | Fill #2

## 2020-01-28 MED FILL — buPROPion HCL ER (XL) 150 M: 150 | 30 days supply | Qty: 30 | Fill #3

## 2020-02-29 ENCOUNTER — Other Ambulatory Visit: Payer: Self-pay | Admitting: Family Medicine

## 2020-02-29 MED FILL — SERTRALINE HCL 100 MG TAB: 100 | 30 days supply | Qty: 30 | Fill #3

## 2020-02-29 MED FILL — ATORVASTATIN 10 MG TABLET: 10 | 90 days supply | Qty: 90 | Fill #0

## 2020-02-29 MED FILL — buPROPion HCL ER (XL) 150 M: 150 | 30 days supply | Qty: 30 | Fill #4

## 2020-03-27 ENCOUNTER — Other Ambulatory Visit: Payer: Self-pay | Admitting: Family Medicine

## 2020-03-27 MED FILL — PANTOPRAZOLE SOD DR 40 MG T: 40 | 90 days supply | Qty: 90 | Fill #0

## 2020-03-27 MED FILL — buPROPion HCL ER (XL) 150 M: 150 | 30 days supply | Qty: 30 | Fill #5

## 2020-03-27 MED FILL — SERTRALINE HCL 100 MG TAB: 100 | 30 days supply | Qty: 30 | Fill #0

## 2020-04-07 ENCOUNTER — Encounter: Payer: Self-pay | Admitting: Family Medicine

## 2020-04-07 DIAGNOSIS — Z1283 Encounter for screening for malignant neoplasm of skin: Secondary | ICD-10-CM

## 2020-04-07 DIAGNOSIS — M25551 Pain in right hip: Secondary | ICD-10-CM

## 2020-04-24 ENCOUNTER — Other Ambulatory Visit: Payer: Self-pay | Admitting: Family Medicine

## 2020-04-24 MED FILL — SERTRALINE HCL 100 MG TAB: 100 | 30 days supply | Qty: 30 | Fill #1

## 2020-04-24 MED FILL — buPROPion HCL ER (XL) 150 M: 150 | 30 days supply | Qty: 30 | Fill #0

## 2020-04-24 MED FILL — METFORMIN HCL 500 MG TABS: 500 | 90 days supply | Qty: 180 | Fill #1

## 2020-04-24 NOTE — Telephone Encounter (Signed)
Please advise, pt has not seen PCP since August.

## 2020-05-07 ENCOUNTER — Telehealth: Payer: Self-pay | Admitting: Family Medicine

## 2020-05-07 NOTE — Telephone Encounter (Signed)
Can we schedule pt?  °

## 2020-05-07 NOTE — Telephone Encounter (Signed)
Pt needs appt for surgical clearance

## 2020-05-07 NOTE — Telephone Encounter (Signed)
Please advise, pt has not been seen in almost a year and EKG is from 2019

## 2020-05-07 NOTE — Telephone Encounter (Signed)
Received pre surgical clearance form from [facility] -Placed in Dr.Tabori's box up front - sa

## 2020-05-11 ENCOUNTER — Telehealth: Payer: No Typology Code available for payment source | Admitting: Family

## 2020-05-11 DIAGNOSIS — R399 Unspecified symptoms and signs involving the genitourinary system: Secondary | ICD-10-CM | POA: Diagnosis not present

## 2020-05-11 MED ORDER — CEPHALEXIN 500 MG PO CAPS: 500.0000 mg | ORAL_CAPSULE | Freq: Two times a day (BID) | ORAL | 0 refills | Status: DC

## 2020-05-11 NOTE — Progress Notes (Signed)

## 2020-05-19 ENCOUNTER — Other Ambulatory Visit: Payer: Self-pay

## 2020-05-19 ENCOUNTER — Encounter: Payer: Self-pay | Admitting: Family Medicine

## 2020-05-19 ENCOUNTER — Ambulatory Visit (INDEPENDENT_AMBULATORY_CARE_PROVIDER_SITE_OTHER): Payer: No Typology Code available for payment source | Admitting: Family Medicine

## 2020-05-19 VITALS — BP 126/84 | HR 76 | Temp 98.0°F | Resp 16 | Ht 66.0 in | Wt 192.5 lb

## 2020-05-19 DIAGNOSIS — E559 Vitamin D deficiency, unspecified: Secondary | ICD-10-CM | POA: Diagnosis not present

## 2020-05-19 DIAGNOSIS — Z01818 Encounter for other preprocedural examination: Secondary | ICD-10-CM

## 2020-05-19 DIAGNOSIS — R7303 Prediabetes: Secondary | ICD-10-CM | POA: Diagnosis not present

## 2020-05-19 NOTE — Progress Notes (Signed)
Subjective:    Tracy Pitts is a 59 y.o. female who presents to the office today for a preoperative consultation at the request of surgeon Dr Alvan Dame who plans on performing R total hip on August 17. This consultation is requested for the specific conditions prompting preoperative evaluation (i.e. because of potential affect on operative risk): none. Planned anesthesia: spinal. The patient has the following known anesthesia issues: no hx of difficulty w/ anesthesia. Patients bleeding risk: no recent abnormal bleeding, no remote history of abnormal bleeding and no use of Ca-channel blockers. Patient does not have objections to receiving blood products if needed.  The following portions of the patient's history were reviewed and updated as appropriate: allergies, current medications, past family history, past medical history, past social history, past surgical history and problem list.  Review of Systems A comprehensive review of systems was negative.    Objective:    BP 126/84   Pulse 76   Temp 98 F (36.7 C) (Tympanic)   Resp 16   Ht 5\' 6"  (1.676 m)   Wt 192 lb 8 oz (87.3 kg)   LMP 10/07/2011   SpO2 97%   BMI 31.07 kg/m   General Appearance:    Alert, cooperative, no distress, appears stated age  Head:    Normocephalic, without obvious abnormality, atraumatic  Eyes:    PERRL, conjunctiva/corneas clear, EOM's intact, fundi    benign, both eyes  Ears:    Normal TM's and external ear canals, both ears  Nose:   Nares normal, septum midline, mucosa normal, no drainage    or sinus tenderness  Throat:   Lips, mucosa, and tongue normal; teeth and gums normal  Neck:   Supple, symmetrical, trachea midline, no adenopathy;    thyroid:  no enlargement/tenderness/nodules  Back:     Symmetric, no curvature, ROM normal, no CVA tenderness  Lungs:     Clear to auscultation bilaterally, respirations unlabored  Chest Wall:    No tenderness or deformity   Heart:    Regular rate and rhythm, S1 and S2  normal, no murmur, rub   or gallop  Breast Exam:    No tenderness, masses, or nipple abnormality  Abdomen:     Soft, non-tender, bowel sounds active all four quadrants,    no masses, no organomegaly  Genitalia:    deferred  Rectal:    Extremities:   Extremities normal, atraumatic, no cyanosis or edema  Pulses:   2+ and symmetric all extremities  Skin:   Skin color, texture, turgor normal, no rashes or lesions  Lymph nodes:   Cervical, supraclavicular, and axillary nodes normal  Neurologic:   CNII-XII intact, normal strength, sensation and reflexes    throughout    Predictors of intubation difficulty:  Morbid obesity? no  Anatomically abnormal facies? no  Prominent incisors? no  Receding mandible? no  Short, thick neck? no  Neck range of motion: normal  Dentition: No chipped, loose, or missing teeth.  Cardiographics ECG: normal sinus rhythm, no blocks or conduction defects, no ischemic changes Echocardiogram: not done  Imaging Chest x-ray: NA    Lab Review  Pending     Assessment:      59 y.o. female with planned surgery as above.   Known risk factors for perioperative complications: None   Difficulty with intubation is not anticipated.  Cardiac Risk Estimation: Low  Current medications which may produce withdrawal symptoms if withheld perioperatively: NA     Plan:    1. Preoperative workup  as follows ECG, hemoglobin, hematocrit, electrolytes, creatinine, glucose, liver function studies. 2. Change in medication regimen before surgery: discontinue Metformin 24 hours before surgery. 3. Prophylaxis for cardiac events with perioperative beta-blockers: not indicated. 4. Invasive hemodynamic monitoring perioperatively: at the discretion of anesthesiologist. 5. Deep vein thrombosis prophylaxis postoperatively:regimen to be chosen by surgical team. 6. Surveillance for postoperative MI with ECG immediately postoperatively and on postoperative days 1 and 2 AND troponin levels  24 hours postoperatively and on day 4 or hospital discharge (whichever comes first): at the discretion of anesthesiologist. 7. Other measures: none

## 2020-05-19 NOTE — Patient Instructions (Addendum)
Follow up as needed or as scheduled We'll notify you of your lab results and make any changes if needed We will send a copy of your labs, EKG, and clearance form to Dr Alvan Dame STOP the Metformin the day before surgery- do not take day before or day of.  Can resume afterwards Call with any questions or concerns GOOD LUCK!!!

## 2020-05-20 ENCOUNTER — Encounter: Payer: Self-pay | Admitting: General Practice

## 2020-05-20 LAB — CBC WITH DIFFERENTIAL/PLATELET
Basophils Absolute: 0.1 10*3/uL (ref 0.0–0.1)
Basophils Relative: 1.3 % (ref 0.0–3.0)
Eosinophils Absolute: 0.1 10*3/uL (ref 0.0–0.7)
Eosinophils Relative: 1.8 % (ref 0.0–5.0)
HCT: 40.7 % (ref 36.0–46.0)
Hemoglobin: 13.6 g/dL (ref 12.0–15.0)
Lymphocytes Relative: 32 % (ref 12.0–46.0)
Lymphs Abs: 1.5 10*3/uL (ref 0.7–4.0)
MCHC: 33.3 g/dL (ref 30.0–36.0)
MCV: 95.4 fl (ref 78.0–100.0)
Monocytes Absolute: 0.4 10*3/uL (ref 0.1–1.0)
Monocytes Relative: 8.4 % (ref 3.0–12.0)
Neutro Abs: 2.6 10*3/uL (ref 1.4–7.7)
Neutrophils Relative %: 56.5 % (ref 43.0–77.0)
Platelets: 217 10*3/uL (ref 150.0–400.0)
RBC: 4.26 Mil/uL (ref 3.87–5.11)
RDW: 12.8 % (ref 11.5–15.5)
WBC: 4.5 10*3/uL (ref 4.0–10.5)

## 2020-05-20 LAB — BASIC METABOLIC PANEL
BUN: 22 mg/dL (ref 6–23)
CO2: 29 mEq/L (ref 19–32)
Calcium: 9.5 mg/dL (ref 8.4–10.5)
Chloride: 104 mEq/L (ref 96–112)
Creatinine, Ser: 0.73 mg/dL (ref 0.40–1.20)
GFR: 81.79 mL/min (ref >60.00)
Glucose, Bld: 83 mg/dL (ref 70–99)
Potassium: 4.2 mEq/L (ref 3.5–5.1)
Sodium: 141 mEq/L (ref 135–145)

## 2020-05-20 LAB — VITAMIN D 25 HYDROXY (VIT D DEFICIENCY, FRACTURES): VITD: 38.24 ng/mL (ref 30.00–100.00)

## 2020-05-20 LAB — HEPATIC FUNCTION PANEL
ALT: 17 U/L (ref 0–35)
AST: 13 U/L (ref 0–37)
Albumin: 4.4 g/dL (ref 3.5–5.2)
Alkaline Phosphatase: 56 U/L (ref 39–117)
Bilirubin, Direct: 0.1 mg/dL (ref 0.0–0.3)
Total Bilirubin: 0.4 mg/dL (ref 0.2–1.2)
Total Protein: 6.5 g/dL (ref 6.0–8.3)

## 2020-05-20 LAB — LIPID PANEL
Cholesterol: 168 mg/dL (ref 0–200)
HDL: 67.1 mg/dL (ref >39.00)
LDL Cholesterol: 89 mg/dL (ref 0–99)
NonHDL: 100.47
Total CHOL/HDL Ratio: 2
Triglycerides: 56 mg/dL (ref 0.0–149.0)
VLDL: 11.2 mg/dL (ref 0.0–40.0)

## 2020-05-20 LAB — HEMOGLOBIN A1C: Hgb A1c MFr Bld: 5.4 % (ref 4.6–6.5)

## 2020-05-20 LAB — TSH: TSH: 1.86 u[IU]/mL (ref 0.35–4.50)

## 2020-05-20 NOTE — Telephone Encounter (Signed)
I have faxed over the pre-surgical clearance form to the # provided on the form and made pt aware of this.

## 2020-05-26 MED FILL — buPROPion HCL ER (XL) 150 M: 150 | 30 days supply | Qty: 30 | Fill #1

## 2020-06-05 ENCOUNTER — Other Ambulatory Visit: Payer: Self-pay | Admitting: Family Medicine

## 2020-06-05 MED FILL — SERTRALINE HCL 100 MG TABS: 100 | 30 days supply | Qty: 30 | Fill #2

## 2020-06-05 MED FILL — ATORVASTATIN CALCIUM 10 MG: 10 | 90 days supply | Qty: 90 | Fill #0

## 2020-06-10 LAB — HM MAMMOGRAPHY: HM Mammogram: NORMAL (ref 0–4)

## 2020-06-12 ENCOUNTER — Encounter: Payer: Self-pay | Admitting: Emergency Medicine

## 2020-06-13 ENCOUNTER — Encounter: Payer: No Typology Code available for payment source | Admitting: Family Medicine

## 2020-06-19 ENCOUNTER — Encounter (HOSPITAL_COMMUNITY): Payer: Self-pay

## 2020-06-19 NOTE — Progress Notes (Signed)
PCP - Dr. Annye Asa Clearance 05-19-20 epic Cardiologist -   PPM/ICD -  Device Orders -  Rep Notified -   Chest x-ray -  EKG - 04-29-20 epic Stress Test -  ECHO -  Cardiac Cath -  Hgba1c- 05-19-20 5.4  Sleep Study -  CPAP -   Fasting Blood Sugar -  Checks Blood Sugar _____ times a day  Blood Thinner Instructions: Aspirin Instructions:  ERAS Protcol - PRE-SURGERY G2-   COVID TEST- 8-13  Activity- Anesthesia review: Pre DM  Patient denies shortness of breath, fever, cough and chest pain at PAT appointment   All instructions explained to the patient, with a verbal understanding of the material. Patient agrees to go over the instructions while at home for a better understanding. Patient also instructed to self quarantine after being tested for COVID-19. The opportunity to ask questions was provided.

## 2020-06-19 NOTE — Patient Instructions (Addendum)
DUE TO COVID-19 ONLY ONE VISITOR IS ALLOWED TO COME WITH YOU AND STAY IN THE WAITING ROOM ONLY DURING PRE OP AND PROCEDURE DAY OF SURGERY. TWO  VISITOR MAY VISIT WITH YOU AFTER SURGERY IN YOUR PRIVATE ROOM DURING VISITING HOURS ONLY!  YOU NEED TO HAVE A COVID 19 TEST ON_8-13-21 @ 11:00 AM, THIS TEST MUST BE DONE BEFORE SURGERY COVID TESTING SITE [address] [place] [address], IT IS ON THE RIGHT GOING OUT [address] [place] 2 MINUTES PAST [employer] ON THE RIGHT. ONCE YOUR COVID TEST IS COMPLETED,  PLEASE BEGIN THE QUARANTINE INSTRUCTIONS AS OUTLINED IN YOUR HANDOUT.                Tracy Pitts  06/19/2020   Your procedure is scheduled on: 07-08-20   Report to [facility] Main  Entrance    Report to admitting at   610 AM     Call this number if you have problems the morning of surgery [phone]    Remember: AFTER MIDNIGHT THE NIGHT PRIOR TO SURGERY. NOTHING BY MOUTH EXCEPT CLEAR LIQUIDS UNTIL  05:40 AM . PLEASE FINISH G2 DRINK PER SURGEON ORDER  WHICH NEEDS TO BE COMPLETED AT 05:40 am. THEN NOTHING BY MOUTH .      CLEAR LIQUID DIET   Foods Allowed                                                                                        Foods Excluded  Coffee and tea, regular and decaf  NO creamer                             liquids that you cannot  Plain Jell-O any favor except red or purple                                           see through such as: Fruit ices (not with fruit pulp)                                                                   milk, soups, orange juice                                                                                 Carbonated beverages, regular and diet  All solid food Cranberry, grape and apple juices Sports drinks like Gatorade Lightly seasoned clear broth or consume(fat free) Sugar, honey  syrup  _____________________________________________________________________     Take these medicines the morning of surgery with A SIP OF WATER: Zoloft, Protonix, Wellbutrin    DO NOT TAKE ANY DIABETIC MEDICATIONS DAY OF YOUR SURGERY                               You may not have any metal on your body including hair pins and              piercings     Do not wear jewelry, make-up, lotions, powders or perfumes, deodorant              Do not wear nail polish on your fingernails.  Do not shave  48 hours prior to surgery.     Do not bring valuables to the hospital. [name]  Contacts, dentures or bridgework may not be worn into surgery.      Patients discharged the day of surgery will not be allowed to drive home. IF YOU ARE HAVING SURGERY AND GOING HOME THE SAME DAY, YOU MUST HAVE AN ADULT TO DRIVE YOU HOME AND BE WITH YOU FOR 24 HOURS. YOU MAY GO HOME BY TAXI OR UBER OR ORTHERWISE, BUT AN ADULT MUST ACCOMPANY YOU HOME AND STAY WITH YOU FOR 24 HOURS.  Name and phone number of your driver:Robert Castellanos [phone]  Special Instructions: N/A              Please read over the following fact sheets you were given: _____________________________________________________________________             [facility] - Preparing for Surgery Before surgery, you can play an important role.  Because skin is not sterile, your skin needs to be as free of germs as possible.  You can reduce the number of germs on your skin by washing with CHG (chlorahexidine gluconate) soap before surgery.  CHG is an antiseptic cleaner which kills germs and bonds with the skin to continue killing germs even after washing. Please DO NOT use if you have an allergy to CHG or antibacterial soaps.  If your skin becomes reddened/irritated stop using the CHG and inform your nurse when you arrive at Short Stay. Do not shave (including legs and underarms) for at least 48 hours  prior to the first CHG shower.  You may shave your face/neck. Please follow these instructions carefully:  1.  Shower with CHG Soap the night before surgery and the  morning of Surgery.  2.  If you choose to wash your hair, wash your hair first as usual with your  normal  shampoo.  3.  After you shampoo, rinse your hair and body thoroughly to remove the  shampoo.                           4.  Use CHG as you would any other liquid soap.  You can apply chg directly  to the skin and wash                       Gently with a scrungie or clean washcloth.  5.  Apply the CHG Soap to your  body ONLY FROM THE NECK DOWN.   Do not use on face/ open                           Wound or open sores. Avoid contact with eyes, ears mouth and genitals (private parts).                       Wash face,  Genitals (private parts) with your normal soap.             6.  Wash thoroughly, paying special attention to the area where your surgery  will be performed.  7.  Thoroughly rinse your body with warm water from the neck down.  8.  DO NOT shower/wash with your normal soap after using and rinsing off  the CHG Soap.                9.  Pat yourself dry with a clean towel.            10.  Wear clean pajamas.            11.  Place clean sheets on your bed the night of your first shower and do not  sleep with pets. Day of Surgery : Do not apply any lotions/deodorants the morning of surgery.  Please wear clean clothes to the hospital/surgery center.  FAILURE TO FOLLOW THESE INSTRUCTIONS MAY RESULT IN THE CANCELLATION OF YOUR SURGERY PATIENT SIGNATURE_________________________________  NURSE SIGNATURE__________________________________  ________________________________________________________________________ How to Manage Your Diabetes Before and After Surgery  Why is it important to control my blood sugar before and after surgery? . Improving blood sugar levels before and after surgery helps healing and can limit  problems. . A way of improving blood sugar control is eating a healthy diet by: o  Eating less sugar and carbohydrates o  Increasing activity/exercise o  Talking with your doctor about reaching your blood sugar goals . High blood sugars (greater than 180 mg/dL) can raise your risk of infections and slow your recovery, so you will need to focus on controlling your diabetes during the weeks before surgery. . Make sure that the doctor who takes care of your diabetes knows about your planned surgery including the date and location.  How do I manage my blood sugar before surgery? . Check your blood sugar at least 4 times a day, starting 2 days before surgery, to make sure that the level is not too high or low. o Check your blood sugar the morning of your surgery when you wake up and every 2 hours until you get to the Short Stay unit. . If your blood sugar is less than 70 mg/dL, you will need to treat for low blood sugar: o Do not take insulin. o Treat a low blood sugar (less than 70 mg/dL) with  cup of clear juice (cranberry or apple), 4 glucose tablets, OR glucose gel. o Recheck blood sugar in 15 minutes after treatment (to make sure it is greater than 70 mg/dL). If your blood sugar is not greater than 70 mg/dL on recheck, call [phone] for further instructions. . Report your blood sugar to the short stay nurse when you get to Short Stay.  . If you are admitted to the hospital after surgery: o Your blood sugar will be checked by the staff and you will probably be given insulin after surgery (instead of oral diabetes medicines) to make sure you have good blood  sugar levels. o The goal for blood sugar control after surgery is 80-180 mg/dL.   WHAT DO I DO ABOUT MY DIABETES MEDICATION?  Marland Kitchen Do not take oral diabetes medicines (pills) the morning of surgery.  . THE DAY BEFORE SURGERY, take your usual Metformin         Reviewed and Endorsed by [facility] Patient Education Committee, August  2015  Incentive Spirometer  An incentive spirometer is a tool that can help keep your lungs clear and active. This tool measures how well you are filling your lungs with each breath. Taking long deep breaths may help reverse or decrease the chance of developing breathing (pulmonary) problems (especially infection) following:  A long period of time when you are unable to move or be active. BEFORE THE PROCEDURE   If the spirometer includes an indicator to show your best effort, your nurse or respiratory therapist will set it to a desired goal.  If possible, sit up straight or lean slightly forward. Try not to slouch.  Hold the incentive spirometer in an upright position. INSTRUCTIONS FOR USE  1. Sit on the edge of your bed if possible, or sit up as far as you can in bed or on a chair. 2. Hold the incentive spirometer in an upright position. 3. Breathe out normally. 4. Place the mouthpiece in your mouth and seal your lips tightly around it. 5. Breathe in slowly and as deeply as possible, raising the piston or the ball toward the top of the column. 6. Hold your breath for 3-5 seconds or for as long as possible. Allow the piston or ball to fall to the bottom of the column. 7. Remove the mouthpiece from your mouth and breathe out normally. 8. Rest for a few seconds and repeat Steps 1 through 7 at least 10 times every 1-2 hours when you are awake. Take your time and take a few normal breaths between deep breaths. 9. The spirometer may include an indicator to show your best effort. Use the indicator as a goal to work toward during each repetition. 10. After each set of 10 deep breaths, practice coughing to be sure your lungs are clear. If you have an incision (the cut made at the time of surgery), support your incision when coughing by placing a pillow or rolled up towels firmly against it. Once you are able to get out of bed, walk around indoors and cough well. You may stop using the incentive  spirometer when instructed by your caregiver.  RISKS AND COMPLICATIONS  Take your time so you do not get dizzy or light-headed.  If you are in pain, you may need to take or ask for pain medication before doing incentive spirometry. It is harder to take a deep breath if you are having pain. AFTER USE  Rest and breathe slowly and easily.  It can be helpful to keep track of a log of your progress. Your caregiver can provide you with a simple table to help with this. If you are using the spirometer at home, follow these instructions: [place] IF:   You are having difficultly using the spirometer.  You have trouble using the spirometer as often as instructed.  Your pain medication is not giving enough relief while using the spirometer.  You develop fever of 100.5 F (38.1 C) or higher. SEEK IMMEDIATE MEDICAL CARE IF:   You cough up bloody sputum that had not been present before.  You develop fever of 102 F (38.9  C) or greater.  You develop worsening pain at or near the incision site. MAKE SURE YOU:   Understand these instructions.  Will watch your condition.  Will get help right away if you are not doing well or get worse. Document Released: 03/21/2007 Document Revised: 01/31/2012 Document Reviewed: 05/22/2007 Exit[employer] Patient Information 2014 Exit[employer], [place].   ________________________________________________________________________  WHAT IS A BLOOD TRANSFUSION? Blood Transfusion Information  A transfusion is the replacement of blood or some of its parts. Blood is made up of multiple cells which provide different functions.  Red blood cells carry oxygen and are used for blood loss replacement.  White blood cells fight against infection.  Platelets control bleeding.  Plasma helps clot blood.  Other blood products are available for specialized needs, such as hemophilia or other clotting disorders. BEFORE THE TRANSFUSION  Who gives blood for transfusions?    Healthy volunteers who are fully evaluated to make sure their blood is safe. This is blood bank blood. Transfusion therapy is the safest it has ever been in the practice of medicine. Before blood is taken from a donor, a complete history is taken to make sure that person has no history of diseases nor engages in risky social behavior (examples are intravenous drug use or sexual activity with multiple partners). The donor's travel history is screened to minimize risk of transmitting infections, such as malaria. The donated blood is tested for signs of infectious diseases, such as HIV and hepatitis. The blood is then tested to be sure it is compatible with you in order to minimize the chance of a transfusion reaction. If you or a relative donates blood, this is often done in anticipation of surgery and is not appropriate for emergency situations. It takes many days to process the donated blood. RISKS AND COMPLICATIONS Although transfusion therapy is very safe and saves many lives, the main dangers of transfusion include:   Getting an infectious disease.  Developing a transfusion reaction. This is an allergic reaction to something in the blood you were given. Every precaution is taken to prevent this. The decision to have a blood transfusion has been considered carefully by your caregiver before blood is given. Blood is not given unless the benefits outweigh the risks. AFTER THE TRANSFUSION  Right after receiving a blood transfusion, you will usually feel much better and more energetic. This is especially true if your red blood cells have gotten low (anemic). The transfusion raises the level of the red blood cells which carry oxygen, and this usually causes an energy increase.  The nurse administering the transfusion will monitor you carefully for complications. HOME CARE INSTRUCTIONS  No special instructions are needed after a transfusion. You may find your energy is better. Speak with your  caregiver about any limitations on activity for underlying diseases you may have. SEEK MEDICAL CARE IF:   Your condition is not improving after your transfusion.  You develop redness or irritation at the intravenous (IV) site. SEEK IMMEDIATE MEDICAL CARE IF:  Any of the following symptoms occur over the next 12 hours:  Shaking chills.  You have a temperature by mouth above 102 F (38.9 C), not controlled by medicine.  Chest, back, or muscle pain.  People around you feel you are not acting correctly or are confused.  Shortness of breath or difficulty breathing.  Dizziness and fainting.  You get a rash or develop hives.  You have a decrease in urine output.  Your urine turns a dark color or changes to pink,  red, or brown. Any of the following symptoms occur over the next 10 days:  You have a temperature by mouth above 102 F (38.9 C), not controlled by medicine.  Shortness of breath.  Weakness after normal activity.  The white part of the eye turns yellow (jaundice).  You have a decrease in the amount of urine or are urinating less often.  Your urine turns a dark color or changes to pink, red, or brown. Document Released: 11/05/2000 Document Revised: 01/31/2012 Document Reviewed: 06/24/2008 [facility] Patient Information 2014 [place], [place].  _______________________________________________________________________

## 2020-06-25 ENCOUNTER — Encounter (HOSPITAL_COMMUNITY)
Admission: RE | Admit: 2020-06-25 | Discharge: 2020-06-25 | Disposition: A | Discharge status: Home / Self Care | Payer: No Typology Code available for payment source | Source: Ambulatory Visit | Attending: Orthopedic Surgery | Admitting: Orthopedic Surgery

## 2020-06-25 ENCOUNTER — Encounter (HOSPITAL_COMMUNITY): Payer: Self-pay

## 2020-06-25 ENCOUNTER — Other Ambulatory Visit: Payer: Self-pay

## 2020-06-25 DIAGNOSIS — Z01812 Encounter for preprocedural laboratory examination: Secondary | ICD-10-CM | POA: Diagnosis not present

## 2020-06-25 HISTORY — DX: Prediabetes: R73.03

## 2020-06-25 HISTORY — DX: Other specified postprocedural states: Z98.890

## 2020-06-25 HISTORY — DX: Nausea with vomiting, unspecified: R11.2

## 2020-06-25 LAB — SURGICAL PCR SCREEN
MRSA, PCR: NEGATIVE
Staphylococcus aureus: NEGATIVE

## 2020-06-25 LAB — CBC
HCT: 45.2 % (ref 36.0–46.0)
Hemoglobin: 14.4 g/dL (ref 12.0–15.0)
MCH: 31.5 pg (ref 26.0–34.0)
MCHC: 31.9 g/dL (ref 30.0–36.0)
MCV: 98.9 fL (ref 80.0–100.0)
Platelets: 246 10*3/uL (ref 150–400)
RBC: 4.57 MIL/uL (ref 3.87–5.11)
RDW: 12.3 % (ref 11.5–15.5)
WBC: 5.5 10*3/uL (ref 4.0–10.5)
nRBC: 0 % (ref 0.0–0.2)

## 2020-06-25 LAB — BASIC METABOLIC PANEL
Anion gap: 7 (ref 5–15)
BUN: 24 mg/dL — ABNORMAL HIGH (ref 6–20)
CO2: 27 mmol/L (ref 22–32)
Calcium: 9 mg/dL (ref 8.9–10.3)
Chloride: 105 mmol/L (ref 98–111)
Creatinine, Ser: 0.83 mg/dL (ref 0.44–1.00)
GFR calc Af Amer: >60 mL/min (ref >60)
GFR calc non Af Amer: >60 mL/min (ref >60)
Glucose, Bld: 96 mg/dL (ref 70–99)
Potassium: 4.6 mmol/L (ref 3.5–5.1)
Sodium: 139 mmol/L (ref 135–145)

## 2020-06-25 LAB — GLUCOSE, CAPILLARY: Glucose-Capillary: 101 mg/dL — ABNORMAL HIGH (ref 70–99)

## 2020-06-25 LAB — TYPE AND SCREEN
ABO/RH(D): A NEG
Antibody Screen: NEGATIVE
Weak D: POSITIVE

## 2020-06-25 NOTE — Progress Notes (Signed)
COVID x 2-[employer]   PCP - Dr. Annye Asa Clearance 05-19-20 epic Cardiologist - None No stimulator -   Chest x-ray -  EKG - 04-29-20 epic Stress Test -  ECHO -  Cardiac Cath -  Hgba1c- 05-19-20 5.4  Sleep Study -  CPAP -   05-19-20 HGA1C 5.4   Fasting Blood Sugar -  Checks Blood Sugar __0___ times a day  Blood Thinner Instructions: ASA Hold x 5 days prior Aspirin Instructions:  ERAS Protcol - PRE-SURGERY G2-   COVID TEST- 8-13  Activity- ADL w/o SOB Anesthesia review: Pre DM  Patient denies shortness of breath, fever, cough and chest pain at PAT appointment   All instructions explained to the patient, with a verbal understanding of the material. Patient agrees to go over the instructions while at home for a better understanding. Patient also instructed to self quarantine after being tested for COVID-19. The opportunity to ask questions was provided.

## 2020-06-26 ENCOUNTER — Other Ambulatory Visit (HOSPITAL_COMMUNITY): Payer: Self-pay | Admitting: Student

## 2020-06-26 MED FILL — FERROUS SULFATE 325 MG TAB: 325 (65 FE) | 20 days supply | Qty: 60 | Fill #0

## 2020-06-26 MED FILL — METHOCARBAMOL 500 MG TABS: 500 | 12 days supply | Qty: 50 | Fill #0

## 2020-06-26 MED FILL — ASPIRIN LOW DOSE 81 MG CHEW: 81 | 30 days supply | Qty: 60 | Fill #0

## 2020-06-26 MED FILL — ONDANSETRON HCL 4 MG TABS: 4 | 4 days supply | Qty: 15 | Fill #0

## 2020-06-26 MED FILL — HYDROCODON-APAP 7.5-325: 7.5-325 | 5 days supply | Qty: 56 | Fill #0

## 2020-06-30 ENCOUNTER — Encounter: Payer: Self-pay | Admitting: Family Medicine

## 2020-06-30 ENCOUNTER — Other Ambulatory Visit: Payer: Self-pay | Admitting: Family Medicine

## 2020-06-30 ENCOUNTER — Other Ambulatory Visit: Payer: Self-pay | Admitting: General Practice

## 2020-06-30 MED ORDER — BUPROPION HCL ER (XL) 150 MG PO TB24: 150.0000 mg | ORAL_TABLET | Freq: Every day | ORAL | 0 refills | Status: DC

## 2020-06-30 MED ORDER — SERTRALINE HCL 100 MG PO TABS: 100.0000 mg | ORAL_TABLET | Freq: Every day | ORAL | 0 refills | Status: DC

## 2020-06-30 MED FILL — buPROPion HCL ER (XL) 150 M: 150 | 90 days supply | Qty: 90 | Fill #0

## 2020-06-30 MED FILL — PANTOPRAZOLE SOD DR 40 MG T: 40 | 90 days supply | Qty: 90 | Fill #1

## 2020-07-04 ENCOUNTER — Other Ambulatory Visit (HOSPITAL_COMMUNITY)
Admission: RE | Admit: 2020-07-04 | Discharge: 2020-07-04 | Disposition: A | Discharge status: Home / Self Care | Payer: No Typology Code available for payment source | Source: Ambulatory Visit | Attending: Orthopedic Surgery | Admitting: Orthopedic Surgery

## 2020-07-04 DIAGNOSIS — Z01812 Encounter for preprocedural laboratory examination: Secondary | ICD-10-CM | POA: Insufficient documentation

## 2020-07-04 DIAGNOSIS — Z20822 Contact with and (suspected) exposure to covid-19: Secondary | ICD-10-CM | POA: Diagnosis not present

## 2020-07-04 LAB — SARS CORONAVIRUS 2 (TAT 6-24 HRS): SARS Coronavirus 2: NEGATIVE

## 2020-07-07 MED FILL — SERTRALINE HCL 100 MG TABS: 100 | 30 days supply | Qty: 30 | Fill #3

## 2020-07-07 NOTE — Anesthesia Preprocedure Evaluation (Addendum)
Anesthesia Evaluation  Patient identified by MRN, date of birth, ID band Patient awake    Reviewed: Allergy & Precautions, NPO status , Patient's Chart, lab work & pertinent test results  History of Anesthesia Complications (+) PONV and history of anesthetic complications  Airway Mallampati: I  TM Distance: >3 FB Neck ROM: Full    Dental no notable dental hx. (+) Teeth Intact, Dental Advisory Given   Pulmonary neg pulmonary ROS,    Pulmonary exam normal breath sounds clear to auscultation       Cardiovascular negative cardio ROS Normal cardiovascular exam Rhythm:Regular Rate:Normal     Neuro/Psych PSYCHIATRIC DISORDERS Anxiety Depression negative neurological ROS     GI/Hepatic Neg liver ROS, GERD  Medicated and Controlled,  Endo/Other  diabetes, Type 2, Oral Hypoglycemic AgentsLast a1c 5.4  Renal/GU negative Renal ROS  negative genitourinary   Musculoskeletal  (+) Arthritis , Osteoarthritis,    Abdominal Normal abdominal exam  (+)   Peds  Hematology negative hematology ROS (+)   Anesthesia Other Findings   Reproductive/Obstetrics negative OB ROS                           Anesthesia Physical Anesthesia Plan  ASA: II  Anesthesia Plan: Spinal and MAC   Post-op Pain Management:    Induction:   PONV Risk Score and Plan: 2 and Propofol infusion and TIVA  Airway Management Planned: Natural Airway and Nasal Cannula  Additional Equipment: None  Intra-op Plan:   Post-operative Plan:   Informed Consent: I have reviewed the patients History and Physical, chart, labs and discussed the procedure including the risks, benefits and alternatives for the proposed anesthesia with the patient or authorized representative who has indicated his/her understanding and acceptance.       Plan Discussed with: CRNA  Anesthesia Plan Comments:        Anesthesia Quick Evaluation

## 2020-07-08 ENCOUNTER — Ambulatory Visit (HOSPITAL_COMMUNITY): Payer: No Typology Code available for payment source

## 2020-07-08 ENCOUNTER — Ambulatory Visit (HOSPITAL_COMMUNITY): Payer: No Typology Code available for payment source | Admitting: Anesthesiology

## 2020-07-08 ENCOUNTER — Encounter (HOSPITAL_COMMUNITY): Payer: Self-pay | Admitting: Orthopedic Surgery

## 2020-07-08 ENCOUNTER — Ambulatory Visit (HOSPITAL_COMMUNITY)
Admission: RE | Admit: 2020-07-08 | Discharge: 2020-07-08 | Disposition: A | Discharge status: Home / Self Care | Payer: No Typology Code available for payment source | Attending: Orthopedic Surgery | Admitting: Orthopedic Surgery

## 2020-07-08 ENCOUNTER — Encounter (HOSPITAL_COMMUNITY)
Admission: RE | Disposition: A | Discharge status: Home / Self Care | Payer: Self-pay | Source: Home / Self Care | Attending: Orthopedic Surgery

## 2020-07-08 DIAGNOSIS — Z96642 Presence of left artificial hip joint: Secondary | ICD-10-CM | POA: Insufficient documentation

## 2020-07-08 DIAGNOSIS — M1611 Unilateral primary osteoarthritis, right hip: Secondary | ICD-10-CM | POA: Diagnosis not present

## 2020-07-08 DIAGNOSIS — E119 Type 2 diabetes mellitus without complications: Secondary | ICD-10-CM | POA: Diagnosis not present

## 2020-07-08 DIAGNOSIS — Z96649 Presence of unspecified artificial hip joint: Secondary | ICD-10-CM

## 2020-07-08 DIAGNOSIS — E669 Obesity, unspecified: Secondary | ICD-10-CM | POA: Diagnosis not present

## 2020-07-08 DIAGNOSIS — Z419 Encounter for procedure for purposes other than remedying health state, unspecified: Secondary | ICD-10-CM

## 2020-07-08 DIAGNOSIS — Z6830 Body mass index (BMI) 30.0-30.9, adult: Secondary | ICD-10-CM | POA: Insufficient documentation

## 2020-07-08 HISTORY — PX: TOTAL HIP ARTHROPLASTY: SHX124

## 2020-07-08 SURGERY — ARTHROPLASTY, HIP, TOTAL, ANTERIOR APPROACH
Anesthesia: Monitor Anesthesia Care | Site: Hip | Laterality: Right

## 2020-07-08 MED ORDER — CEFAZOLIN SODIUM-DEXTROSE 2-4 GM/100ML-% IV SOLN
2.0000 g | INTRAVENOUS | Status: AC
  Administered 2020-07-08: 2 g via INTRAVENOUS
  Filled 2020-07-08: qty 100

## 2020-07-08 MED ORDER — METHOCARBAMOL 500 MG IVPB - SIMPLE MED
INTRAVENOUS | Status: AC
  Administered 2020-07-08: 500 mg via INTRAVENOUS
  Filled 2020-07-08: qty 50

## 2020-07-08 MED ORDER — 0.9 % SODIUM CHLORIDE (POUR BTL) OPTIME
TOPICAL | Status: DC | PRN
  Administered 2020-07-08: 1000 mL

## 2020-07-08 MED ORDER — DEXAMETHASONE SODIUM PHOSPHATE 10 MG/ML IJ SOLN
INTRAMUSCULAR | Status: DC | PRN
  Administered 2020-07-08: 10 mg via INTRAVENOUS

## 2020-07-08 MED ORDER — DEXAMETHASONE SODIUM PHOSPHATE 10 MG/ML IJ SOLN
INTRAMUSCULAR | Status: AC
  Filled 2020-07-08: qty 1

## 2020-07-08 MED ORDER — TRANEXAMIC ACID-NACL 1000-0.7 MG/100ML-% IV SOLN: 1000.0000 mg | Freq: Once | INTRAVENOUS | Status: DC

## 2020-07-08 MED ORDER — OXYCODONE HCL 5 MG/5ML PO SOLN: 5.0000 mg | Freq: Once | ORAL | Status: DC | PRN

## 2020-07-08 MED ORDER — ONDANSETRON HCL 4 MG/2ML IJ SOLN
INTRAMUSCULAR | Status: AC
  Filled 2020-07-08: qty 2

## 2020-07-08 MED ORDER — OXYCODONE HCL 5 MG PO TABS: 5.0000 mg | ORAL_TABLET | Freq: Once | ORAL | Status: DC | PRN

## 2020-07-08 MED ORDER — SCOPOLAMINE 1 MG/3DAYS TD PT72
1.0000 | MEDICATED_PATCH | TRANSDERMAL | Status: DC
  Administered 2020-07-08: 1.5 mg via TRANSDERMAL
  Filled 2020-07-08: qty 1

## 2020-07-08 MED ORDER — METOCLOPRAMIDE HCL 5 MG/ML IJ SOLN: 5.0000 mg | Freq: Three times a day (TID) | INTRAMUSCULAR | Status: DC | PRN

## 2020-07-08 MED ORDER — LACTATED RINGERS IV BOLUS
250.0000 mL | Freq: Once | INTRAVENOUS | Status: AC
  Administered 2020-07-08: 250 mL via INTRAVENOUS

## 2020-07-08 MED ORDER — PROPOFOL 500 MG/50ML IV EMUL
INTRAVENOUS | Status: AC
  Filled 2020-07-08: qty 50

## 2020-07-08 MED ORDER — KETOROLAC TROMETHAMINE 30 MG/ML IJ SOLN: 30.0000 mg | Freq: Once | INTRAMUSCULAR | Status: AC | PRN

## 2020-07-08 MED ORDER — TRANEXAMIC ACID-NACL 1000-0.7 MG/100ML-% IV SOLN
1000.0000 mg | INTRAVENOUS | Status: AC
  Administered 2020-07-08: 1000 mg via INTRAVENOUS
  Filled 2020-07-08: qty 100

## 2020-07-08 MED ORDER — EPHEDRINE SULFATE-NACL 50-0.9 MG/10ML-% IV SOSY
PREFILLED_SYRINGE | INTRAVENOUS | Status: DC | PRN
  Administered 2020-07-08 (×2): 10 mg via INTRAVENOUS

## 2020-07-08 MED ORDER — LACTATED RINGERS IV BOLUS
500.0000 mL | Freq: Once | INTRAVENOUS | Status: AC
  Administered 2020-07-08: 500 mL via INTRAVENOUS

## 2020-07-08 MED ORDER — HYDROMORPHONE HCL 1 MG/ML IJ SOLN: 0.2500 mg | INTRAMUSCULAR | Status: DC | PRN

## 2020-07-08 MED ORDER — ASPIRIN 81 MG PO CHEW: 81.0000 mg | CHEWABLE_TABLET | Freq: Two times a day (BID) | ORAL | 0 refills | Status: AC

## 2020-07-08 MED ORDER — MEPIVACAINE HCL (PF) 2 % IJ SOLN
INTRAMUSCULAR | Status: DC | PRN
  Administered 2020-07-08: 3.5 mL via INTRATHECAL

## 2020-07-08 MED ORDER — METOCLOPRAMIDE HCL 5 MG PO TABS
5.0000 mg | ORAL_TABLET | Freq: Three times a day (TID) | ORAL | Status: DC | PRN
  Filled 2020-07-08: qty 2

## 2020-07-08 MED ORDER — LIDOCAINE 2% (20 MG/ML) 5 ML SYRINGE
INTRAMUSCULAR | Status: AC
  Filled 2020-07-08: qty 5

## 2020-07-08 MED ORDER — METHOCARBAMOL 500 MG PO TABS: 500.0000 mg | ORAL_TABLET | Freq: Four times a day (QID) | ORAL | Status: DC | PRN

## 2020-07-08 MED ORDER — HYDROCODONE-ACETAMINOPHEN 7.5-325 MG PO TABS: 1.0000 | ORAL_TABLET | ORAL | 0 refills | Status: DC | PRN

## 2020-07-08 MED ORDER — ACETAMINOPHEN 325 MG PO TABS: 325.0000 mg | ORAL_TABLET | Freq: Four times a day (QID) | ORAL | Status: DC | PRN

## 2020-07-08 MED ORDER — LIDOCAINE 2% (20 MG/ML) 5 ML SYRINGE
INTRAMUSCULAR | Status: DC | PRN
  Administered 2020-07-08: 40 mg via INTRAVENOUS

## 2020-07-08 MED ORDER — DEXAMETHASONE SODIUM PHOSPHATE 10 MG/ML IJ SOLN: 10.0000 mg | Freq: Once | INTRAMUSCULAR | Status: DC

## 2020-07-08 MED ORDER — CHLORHEXIDINE GLUCONATE 0.12 % MT SOLN
15.0000 mL | Freq: Once | OROMUCOSAL | Status: AC
  Administered 2020-07-08: 15 mL via OROMUCOSAL

## 2020-07-08 MED ORDER — ACETAMINOPHEN 500 MG PO TABS
1000.0000 mg | ORAL_TABLET | Freq: Once | ORAL | Status: AC
  Administered 2020-07-08: 1000 mg via ORAL
  Filled 2020-07-08: qty 2

## 2020-07-08 MED ORDER — MEPIVACAINE HCL (PF) 2 % IJ SOLN
INTRAMUSCULAR | Status: AC
  Filled 2020-07-08: qty 20

## 2020-07-08 MED ORDER — PROPOFOL 500 MG/50ML IV EMUL
INTRAVENOUS | Status: DC | PRN
  Administered 2020-07-08: 75 ug/kg/min via INTRAVENOUS

## 2020-07-08 MED ORDER — PROMETHAZINE HCL 25 MG/ML IJ SOLN: 6.2500 mg | INTRAMUSCULAR | Status: DC | PRN

## 2020-07-08 MED ORDER — DIPHENHYDRAMINE HCL 50 MG/ML IJ SOLN
INTRAMUSCULAR | Status: AC
  Filled 2020-07-08: qty 1

## 2020-07-08 MED ORDER — MIDAZOLAM HCL 2 MG/2ML IJ SOLN
INTRAMUSCULAR | Status: AC
  Filled 2020-07-08: qty 2

## 2020-07-08 MED ORDER — FENTANYL CITRATE (PF) 100 MCG/2ML IJ SOLN
INTRAMUSCULAR | Status: DC | PRN
  Administered 2020-07-08 (×2): 50 ug via INTRAVENOUS

## 2020-07-08 MED ORDER — METHOCARBAMOL 500 MG IVPB - SIMPLE MED: 500.0000 mg | Freq: Four times a day (QID) | INTRAVENOUS | Status: DC | PRN

## 2020-07-08 MED ORDER — POLYETHYLENE GLYCOL 3350 17 G PO PACK
17.0000 g | PACK | Freq: Two times a day (BID) | ORAL | 0 refills | Status: DC
Start: 2020-07-08 — End: 2021-01-12

## 2020-07-08 MED ORDER — HYDROCODONE-ACETAMINOPHEN 5-325 MG PO TABS: 1.0000 | ORAL_TABLET | ORAL | Status: DC | PRN

## 2020-07-08 MED ORDER — METHOCARBAMOL 500 MG PO TABS: 500.0000 mg | ORAL_TABLET | Freq: Four times a day (QID) | ORAL | 0 refills | Status: DC | PRN

## 2020-07-08 MED ORDER — MIDAZOLAM HCL 5 MG/5ML IJ SOLN
INTRAMUSCULAR | Status: DC | PRN
  Administered 2020-07-08: 2 mg via INTRAVENOUS

## 2020-07-08 MED ORDER — FENTANYL CITRATE (PF) 100 MCG/2ML IJ SOLN
INTRAMUSCULAR | Status: AC
  Filled 2020-07-08: qty 2

## 2020-07-08 MED ORDER — CEFAZOLIN SODIUM-DEXTROSE 2-4 GM/100ML-% IV SOLN: 2.0000 g | Freq: Four times a day (QID) | INTRAVENOUS | Status: DC

## 2020-07-08 MED ORDER — ONDANSETRON HCL 4 MG PO TABS
4.0000 mg | ORAL_TABLET | Freq: Four times a day (QID) | ORAL | Status: DC | PRN
  Filled 2020-07-08: qty 1

## 2020-07-08 MED ORDER — PROPOFOL 10 MG/ML IV BOLUS
INTRAVENOUS | Status: DC | PRN
  Administered 2020-07-08: 20 mg via INTRAVENOUS
  Administered 2020-07-08: 30 mg via INTRAVENOUS

## 2020-07-08 MED ORDER — HYDROMORPHONE HCL 1 MG/ML IJ SOLN
INTRAMUSCULAR | Status: AC
  Administered 2020-07-08: 0.5 mg via INTRAVENOUS
  Filled 2020-07-08: qty 1

## 2020-07-08 MED ORDER — STERILE WATER FOR IRRIGATION IR SOLN
Status: DC | PRN
  Administered 2020-07-08: 2000 mL

## 2020-07-08 MED ORDER — EPHEDRINE 5 MG/ML INJ
INTRAVENOUS | Status: AC
  Filled 2020-07-08: qty 10

## 2020-07-08 MED ORDER — ONDANSETRON HCL 4 MG/2ML IJ SOLN: 4.0000 mg | Freq: Four times a day (QID) | INTRAMUSCULAR | Status: DC | PRN

## 2020-07-08 MED ORDER — KETOROLAC TROMETHAMINE 30 MG/ML IJ SOLN
INTRAMUSCULAR | Status: AC
  Administered 2020-07-08: 30 mg via INTRAVENOUS
  Filled 2020-07-08: qty 1

## 2020-07-08 MED ORDER — ONDANSETRON HCL 4 MG/2ML IJ SOLN
INTRAMUSCULAR | Status: DC | PRN
  Administered 2020-07-08: 4 mg via INTRAVENOUS

## 2020-07-08 MED ORDER — MEPERIDINE HCL 50 MG/ML IJ SOLN: 6.2500 mg | INTRAMUSCULAR | Status: DC | PRN

## 2020-07-08 MED ORDER — DOCUSATE SODIUM 100 MG PO CAPS
100.0000 mg | ORAL_CAPSULE | Freq: Two times a day (BID) | ORAL | 0 refills | Status: DC
Start: 2020-07-08 — End: 2021-01-12

## 2020-07-08 MED ORDER — ORAL CARE MOUTH RINSE: 15.0000 mL | Freq: Once | OROMUCOSAL | Status: AC

## 2020-07-08 MED ORDER — HYDROCODONE-ACETAMINOPHEN 7.5-325 MG PO TABS
1.0000 | ORAL_TABLET | ORAL | Status: DC | PRN
  Administered 2020-07-08: 1 via ORAL

## 2020-07-08 MED ORDER — LACTATED RINGERS IV SOLN: INTRAVENOUS | Status: DC

## 2020-07-08 MED ORDER — FERROUS SULFATE 325 (65 FE) MG PO TABS: 325.0000 mg | ORAL_TABLET | Freq: Three times a day (TID) | ORAL | 0 refills | Status: DC

## 2020-07-08 MED ORDER — HYDROCODONE-ACETAMINOPHEN 7.5-325 MG PO TABS
ORAL_TABLET | ORAL | Status: AC
  Filled 2020-07-08: qty 1

## 2020-07-08 SURGICAL SUPPLY — 47 items
ADH SKN CLS APL DERMABOND .7 (GAUZE/BANDAGES/DRESSINGS) ×1
BAG DECANTER FOR FLEXI CONT (MISCELLANEOUS) IMPLANT
BAG SPEC THK2 15X12 ZIP CLS (MISCELLANEOUS)
BAG ZIPLOCK 12X15 (MISCELLANEOUS) IMPLANT
BLADE SAG 18X100X1.27 (BLADE) ×3 IMPLANT
BLADE SURG SZ10 CARB STEEL (BLADE) ×6 IMPLANT
COVER PERINEAL POST (MISCELLANEOUS) ×3 IMPLANT
COVER SURGICAL LIGHT HANDLE (MISCELLANEOUS) ×3 IMPLANT
COVER WAND RF STERILE (DRAPES) IMPLANT
CUP ACETBLR 52 OD PINNACLE (Hips) ×3 IMPLANT
DERMABOND ADVANCED (GAUZE/BANDAGES/DRESSINGS) ×2
DERMABOND ADVANCED .7 DNX12 (GAUZE/BANDAGES/DRESSINGS) ×1 IMPLANT
DRAPE STERI IOBAN 125X83 (DRAPES) ×3 IMPLANT
DRAPE U-SHAPE 47X51 STRL (DRAPES) ×6 IMPLANT
DRESSING AQUACEL AG SP 3.5X10 (GAUZE/BANDAGES/DRESSINGS) ×1 IMPLANT
DRSG AQUACEL AG SP 3.5X10 (GAUZE/BANDAGES/DRESSINGS) ×3
DURAPREP 26ML APPLICATOR (WOUND CARE) ×3 IMPLANT
ELECT REM PT RETURN 15FT ADLT (MISCELLANEOUS) ×3 IMPLANT
ELIMINATOR HOLE APEX DEPUY (Hips) ×3 IMPLANT
GLOVE BIO SURGEON STRL SZ 6 (GLOVE) ×6 IMPLANT
GLOVE BIOGEL PI IND STRL 6.5 (GLOVE) ×1 IMPLANT
GLOVE BIOGEL PI IND STRL 7.5 (GLOVE) ×1 IMPLANT
GLOVE BIOGEL PI IND STRL 8.5 (GLOVE) ×1 IMPLANT
GLOVE BIOGEL PI INDICATOR 6.5 (GLOVE) ×2
GLOVE BIOGEL PI INDICATOR 7.5 (GLOVE) ×2
GLOVE BIOGEL PI INDICATOR 8.5 (GLOVE) ×2
GLOVE ECLIPSE 8.0 STRL XLNG CF (GLOVE) ×6 IMPLANT
GLOVE ORTHO TXT STRL SZ7.5 (GLOVE) ×6 IMPLANT
GOWN STRL REUS W/TWL LRG LVL3 (GOWN DISPOSABLE) ×6 IMPLANT
GOWN STRL REUS W/TWL XL LVL3 (GOWN DISPOSABLE) ×3 IMPLANT
HEAD CERAMIC DELTA 36 PLUS 1.5 (Hips) ×3 IMPLANT
HOLDER FOLEY CATH W/STRAP (MISCELLANEOUS) ×3 IMPLANT
KIT TURNOVER KIT A (KITS) IMPLANT
LINER NEUTRAL 52X36MM PLUS 4 (Liner) ×3 IMPLANT
PACK ANTERIOR HIP CUSTOM (KITS) ×3 IMPLANT
PENCIL SMOKE EVACUATOR (MISCELLANEOUS) ×3 IMPLANT
SCREW 6.5MMX30MM (Screw) ×3 IMPLANT
STEM FEM SZ3 STD ACTIS (Stem) ×3 IMPLANT
SUT MNCRL AB 4-0 PS2 18 (SUTURE) ×3 IMPLANT
SUT STRATAFIX 0 PDS 27 VIOLET (SUTURE) ×3
SUT VIC AB 1 CT1 36 (SUTURE) ×9 IMPLANT
SUT VIC AB 2-0 CT1 27 (SUTURE) ×6
SUT VIC AB 2-0 CT1 TAPERPNT 27 (SUTURE) ×2 IMPLANT
SUTURE STRATFX 0 PDS 27 VIOLET (SUTURE) ×1 IMPLANT
TRAY FOLEY MTR SLVR 14FR STAT (SET/KITS/TRAYS/PACK) ×3 IMPLANT
WATER STERILE IRR 1000ML POUR (IV SOLUTION) ×3 IMPLANT
YANKAUER SUCT BULB TIP 10FT TU (MISCELLANEOUS) IMPLANT

## 2020-07-08 NOTE — Transfer of Care (Signed)
Immediate Anesthesia Transfer of Care Note  Patient: Tracy Pitts  Procedure(s) Performed: TOTAL HIP ARTHROPLASTY ANTERIOR APPROACH (Right Hip)  Patient Location: PACU  Anesthesia Type:Spinal  Level of Consciousness: awake  Airway & Oxygen Therapy: Patient Spontanous Breathing and Patient connected to face mask oxygen  Post-op Assessment: Report given to RN and Post -op Vital signs reviewed and stable  Post vital signs: Reviewed and stable  Last Vitals:  Vitals Value Taken Time  BP    Temp    Pulse    Resp 11 07/08/20 1026  SpO2    Vitals shown include unvalidated device data.  Last Pain:  Vitals:   07/08/20 0643  TempSrc: Oral         Complications: No complications documented.

## 2020-07-08 NOTE — Anesthesia Procedure Notes (Signed)
Date/Time: 07/08/2020 8:34 AM Performed by: Sharlette Dense, CRNA Oxygen Delivery Method: Simple face mask

## 2020-07-08 NOTE — Op Note (Signed)
NAME:  ELZADA PYTEL                ACCOUNT NO.: [id number]      MEDICAL RECORD NO.: [id number]      FACILITY:  [facility]      PHYSICIAN:  Mauri Pole  DATE OF BIRTH:  03-Mar-1961     DATE OF PROCEDURE:  07/08/2020                                 OPERATIVE REPORT         PREOPERATIVE DIAGNOSIS: Right  hip osteoarthritis.      POSTOPERATIVE DIAGNOSIS:  Right hip osteoarthritis.      PROCEDURE:  Right total hip replacement through an anterior approach   utilizing DePuy THR system, component size 52 mm pinnacle cup, a size 36+4 neutral   Altrex liner, a size 3 standard Actis stem with a 36+1.5 delta ceramic   ball.      SURGEON:  Pietro Cassis. Alvan Dame, M.D.      ASSISTANT:  Danae Orleans, PA-C     ANESTHESIA:  Spinal.      SPECIMENS:  None.      COMPLICATIONS:  None.      BLOOD LOSS:  200 cc     DRAINS:  None.      INDICATION OF THE PROCEDURE:  Tracy Pitts is a 59 y.o. female who had   presented to office for evaluation of right hip pain.  Radiographs revealed   progressive degenerative changes with bone-on-bone   articulation of the  hip joint, including subchondral cystic changes and osteophytes.  The patient had painful limited range of   motion significantly affecting their overall quality of life and function.  The patient was failing to    respond to conservative measures including medications and/or injections and activity modification and at this point was ready   to proceed with more definitive measures.  Consent was obtained for   benefit of pain relief.  Specific risks of infection, DVT, component   failure, dislocation, neurovascular injury, and need for revision surgery were reviewed in the office as well discussion of   the anterior versus posterior approach were reviewed.     PROCEDURE IN DETAIL:  The patient was brought to operative theater.   Once adequate anesthesia, preoperative antibiotics, 2 gm of Ancef, 1 gm of Tranexamic  Acid, and 10 mg of Decadron were administered, the patient was positioned supine on the [employer] table.  Once the patient was safely positioned with adequate padding of boney prominences we predraped out the hip, and used fluoroscopy to confirm orientation of the pelvis.      The right hip was then prepped and draped from proximal iliac crest to   mid thigh with a shower curtain technique.      Time-out was performed identifying the patient, planned procedure, and the appropriate extremity.     An incision was then made 2 cm lateral to the   anterior superior iliac spine extending over the orientation of the   tensor fascia lata muscle and sharp dissection was carried down to the   fascia of the muscle.      The fascia was then incised.  The muscle belly was identified and swept   laterally and retractor placed along the superior neck.  Following   cauterization of the circumflex vessels and removing some  pericapsular   fat, a second cobra retractor was placed on the inferior neck.  A T-capsulotomy was made along the line of the   superior neck to the trochanteric fossa, then extended proximally and   distally.  Tag sutures were placed and the retractors were then placed   intracapsular.  We then identified the trochanteric fossa and   orientation of my neck cut and then made a neck osteotomy with the femur on traction.  The femoral   head was removed without difficulty or complication.  Traction was let   off and retractors were placed posterior and anterior around the   acetabulum.      The labrum and foveal tissue were debrided.  I began reaming with a 44 mm   reamer and reamed up to 51 mm reamer with good bony bed preparation and a 52 mm  cup was chosen.  The final 52 mm Pinnacle cup was then impacted under fluoroscopy to confirm the depth of penetration and orientation with respect to   Abduction and forward flexion.  A screw was placed into the ilium followed by the hole eliminator.   The final   36+4 neutral Altrex liner was impacted with good visualized rim fit.  The cup was positioned anatomically within the acetabular portion of the pelvis.      At this point, the femur was rolled to 100 degrees.  Further capsule was   released off the inferior aspect of the femoral neck.  I then   released the superior capsule proximally.  With the leg in a neutral position the hook was placed laterally   along the femur under the vastus lateralis origin and elevated manually and then held in position using the hook attachment on the bed.  The leg was then extended and adducted with the leg rolled to 100   degrees of external rotation.  Retractors were placed along the medial calcar and posteriorly over the greater trochanter.  Once the proximal femur was fully   exposed, I used a box osteotome to set orientation.  I then began   broaching with the starting chili pepper broach and passed this by hand and then broached up to 3.  With the 3 broach in place I chose a standard offset neck and did several trial reductions.  The offset was appropriate, leg lengths   appeared to be equal best matched with the +1.5 head ball trial confirmed radiographically.   Given these findings, I went ahead and dislocated the hip, repositioned all   retractors and positioned the right hip in the extended and abducted position.  The final 3 standard Actis stem was   chosen and it was impacted down to the level of neck cut.  Based on this   and the trial reductions, a final 36+1.5 delta ceramic ball was chosen and   impacted onto a clean and dry trunnion, and the hip was reduced.  The   hip had been irrigated throughout the case again at this point.  I did   reapproximate the superior capsular leaflet to the anterior leaflet   using #1 Vicryl.  The fascia of the   tensor fascia lata muscle was then reapproximated using #1 Vicryl and #0 Stratafix sutures.  The   remaining wound was closed with 2-0 Vicryl and  running 4-0 Monocryl.   The hip was cleaned, dried, and dressed sterilely using Dermabond and   Aquacel dressing.  The patient was then brought   to  recovery room in stable condition tolerating the procedure well.    Danae Orleans, PA-C was present for the entirety of the case involved from   preoperative positioning, perioperative retractor management, general   facilitation of the case, as well as primary wound closure as assistant.            Pietro Cassis Alvan Dame, M.D.        07/08/2020 10:10 AM

## 2020-07-08 NOTE — Anesthesia Postprocedure Evaluation (Signed)
Anesthesia Post Note  Patient: Tracy Pitts  Procedure(s) Performed: TOTAL HIP ARTHROPLASTY ANTERIOR APPROACH (Right Hip)     Patient location during evaluation: PACU Anesthesia Type: MAC and Spinal Level of consciousness: awake and alert Pain management: pain level controlled Vital Signs Assessment: post-procedure vital signs reviewed and stable Respiratory status: spontaneous breathing, nonlabored ventilation and respiratory function stable Cardiovascular status: blood pressure returned to baseline and stable Postop Assessment: no apparent nausea or vomiting, spinal receding, no headache and no backache Anesthetic complications: no   No complications documented.  Last Vitals:  Vitals:   07/08/20 1045 07/08/20 1100  BP: 115/75 123/70  Pulse: 65 70  Resp: (!) 9 10  Temp:  (!) 36.3 C  SpO2: 100% 97%    Last Pain:  Vitals:   07/08/20 1100  TempSrc:   PainSc: [place]

## 2020-07-08 NOTE — Anesthesia Procedure Notes (Signed)
Spinal  Patient location during procedure: OR Start time: 07/08/2020 8:34 AM End time: 07/08/2020 8:37 AM Staffing Performed: anesthesiologist  Anesthesiologist: Pervis Hocking, DO Preanesthetic Checklist Completed: patient identified, IV checked, risks and benefits discussed, surgical consent, monitors and equipment checked, pre-op evaluation and timeout performed Spinal Block Patient position: sitting Prep: DuraPrep and site prepped and draped Patient monitoring: cardiac monitor, continuous pulse ox and blood pressure Approach: midline Location: L3-4 Injection technique: single-shot Needle Needle type: Pencan  Needle gauge: 24 G Needle length: 9 cm Assessment Sensory level: T6 Additional Notes Functioning IV was confirmed and monitors were applied. Sterile prep and drape, including hand hygiene and sterile gloves were used. The patient was positioned and the spine was prepped. The skin was anesthetized with lidocaine.  Free flow of clear CSF was obtained prior to injecting local anesthetic into the CSF.  The spinal needle aspirated freely following injection.  The needle was carefully withdrawn.  The patient tolerated the procedure well.

## 2020-07-08 NOTE — Evaluation (Signed)
Physical Therapy Evaluation Patient Details Name: Tracy Pitts MRN: [id number] DOB: [birth year] Today's Date: 07/08/2020   History of Present Illness  Patient is 59 y.o. female s/p Rt THA anterior approach on 07/08/20 with PMH significant for GERD, OA, depression, anxiety, Lt THA in 2019.    Clinical Impression  Tracy Pitts is a 59 y.o. female POD 0 s/p Rt THA. Patient reports independence with mobility at baseline. Patient is now limited by functional impairments (see PT problem list below) and requires min guard/supervision for transfers and gait with RW. Patient was able to ambulate ~160 feet with RW and supervision and cues for safe walker management. Patient educated on safe sequencing for stair mobility and verbalized safe guarding position for people assisting with mobility. Patient instructed in exercises to facilitate ROM and circulation. Patient will benefit from continued skilled PT interventions to address impairments and progress towards PLOF. Patient has met mobility goals at adequate level for discharge home; will continue to follow if pt continues acute stay to progress towards Mod I goals.     Follow Up Recommendations Follow surgeon's recommendation for DC plan and follow-up therapies    Equipment Recommendations  None recommended by PT    Recommendations for Other Services       Precautions / Restrictions Precautions Precautions: Fall Restrictions Weight Bearing Restrictions: No      Mobility  Bed Mobility Overal bed mobility: Needs Assistance Bed Mobility: Supine to Sit;Sit to Supine     Supine to sit: Supervision Sit to supine: Supervision   General bed mobility comments: extra time required for pt but no assistance. pt using Lt LE to assist Rt LE moving in/out of bed.   Transfers Overall transfer level: Needs assistance Equipment used: Rolling walker (2 wheeled) Transfers: Sit to/from [employer] Sit to Stand: Min  guard;Supervision Stand pivot transfers: Min guard       General transfer comment: Cues for safe technique with RW, no assist required for rising. pt pivoting from commode to turn to walker.  Ambulation/Gait Ambulation/Gait assistance: Min guard;Supervision Gait Distance (Feet): 160 Feet Assistive device: Rolling walker (2 wheeled) Gait Pattern/deviations: Step-to pattern Gait velocity: fair   General Gait Details: cues for safe step pattern and proximity to RW, no overt LOB noted.   Stairs Stairs: Yes Stairs assistance: Supervision;Min guard Stair Management: One rail Right;Step to pattern;Sideways Number of Stairs: 4 (2x2) General stair comments: cues for safe step pattern "up with good, down with bad" no overt LOB noted. pt verbailzed understanding of safe guarding position for her husband.   Wheelchair Mobility    Modified Rankin (Stroke Patients Only)       Balance Overall balance assessment: Mild deficits observed, not formally tested                          Pertinent Vitals/Pain Pain Assessment: 0-10 Pain Score: 2  Pain Location: Rt hip Pain Descriptors / Indicators: Aching;Burning;Discomfort;Dull Pain Intervention(s): Limited activity within patient's tolerance;Monitored during session;Repositioned    Home Living Family/patient expects to be discharged to:: Private residence Living Arrangements: Spouse/significant other Available Help at Discharge: Family Type of Home: House Home Access: Stairs to enter Entrance Stairs-Rails: Chemical engineer of Steps: 5 Home Layout: Two level;Full bath on main level;Able to live on main level with bedroom/bathroom Home Equipment: Gilford Rile - 2 wheels;Cane - single point;Grab bars - toilet;Grab bars - tub/shower      Prior Function Level of Independence: Independent  Hand Dominance   Dominant Hand: Right    Extremity/Trunk Assessment   Upper Extremity Assessment Upper  Extremity Assessment: Overall WFL for tasks assessed    Lower Extremity Assessment Lower Extremity Assessment: Overall WFL for tasks assessed    Cervical / Trunk Assessment Cervical / Trunk Assessment: Normal  Communication   Communication: No difficulties  Cognition Arousal/Alertness: Awake/alert Behavior During Therapy: WFL for tasks assessed/performed Overall Cognitive Status: Within Functional Limits for tasks assessed           General Comments      Exercises Total Joint Exercises Ankle Circles/Pumps: Both;10 reps;Supine;AROM Quad Sets: AROM;Right;Other reps (comment);Supine (3) Short Arc Quad: AROM;Right;Other reps (comment);Supine (3) Heel Slides: AROM;Right;Other reps (comment);Supine (3) Hip ABduction/ADduction: AROM;Right;Other reps (comment);Supine (3)   Assessment/Plan    PT Assessment Patient needs continued PT services  PT Problem List Decreased strength;Decreased range of motion;Decreased activity tolerance;Decreased balance;Decreased mobility;Decreased knowledge of use of DME;Decreased knowledge of precautions       PT Treatment Interventions DME instruction;Gait training;Stair training;Functional mobility training;Therapeutic activities;Therapeutic exercise;Balance training;Patient/family education    PT Goals (Current goals can be found in the Care Plan section)  Acute Rehab PT Goals Patient Stated Goal: get back to independence and go home today PT Goal Formulation: With patient Time For Goal Achievement: 07/11/20 Potential to Achieve Goals: Good    Frequency 7X/week   Barriers to discharge           AM-PAC PT "6 Clicks" Mobility  Outcome Measure Help needed turning from your back to your side while in a flat bed without using bedrails?: None Help needed moving from lying on your back to sitting on the side of a flat bed without using bedrails?: None Help needed moving to and from a bed to a chair (including a wheelchair)?: A Little Help  needed standing up from a chair using your arms (e.g., wheelchair or bedside chair)?: A Little Help needed to walk in hospital room?: A Little Help needed climbing 3-5 steps with a railing? : A Little 6 Click Score: 20    End of Session Equipment Utilized During Treatment: Gait belt Activity Tolerance: Patient tolerated treatment well Patient left: in bed;with call bell/phone within reach Nurse Communication: Mobility status PT Visit Diagnosis: Muscle weakness (generalized) (M62.81);Difficulty in walking, not elsewhere classified (R26.2)    Time: 3462-1947 PT Time Calculation (min) (ACUTE ONLY): 30 min   Charges:   PT Evaluation $PT Eval Low Complexity: 1 Low PT Treatments $Gait Training: 8-22 mins        Verner Mould, DPT Acute Rehabilitation Services  Office [phone] Pager [phone]  07/08/2020 3:15 PM

## 2020-07-08 NOTE — H&P (Signed)
TOTAL HIP ADMISSION H&P  Patient is admitted for right total hip arthroplasty.  Subjective:  Chief Complaint: right hip pain  HPI: Tracy Pitts, 59 y.o. female, has a history of pain and functional disability in the right hip(s) due to arthritis and patient has failed non-surgical conservative treatments for greater than 12 weeks to include activity modification.  Onset of symptoms was gradual starting 1 years ago with gradually worsening course since that time.The patient noted no past surgery on the right hip(s).  Patient currently rates pain in the right hip at 9 out of 10 with activity. Patient has worsening of pain with activity and weight bearing and pain that interfers with activities of daily living. Patient has evidence of joint space narrowing by imaging studies. This condition presents safety issues increasing the risk of falls.  There is no current active infection.  Patient Active Problem List   Diagnosis Date Noted  . S/P left THA, AA 06/20/2018  . Heart palpitations 05/09/2018  . Excessive daytime sleepiness 05/09/2018  . Obesity (BMI 30-39.9) 05/09/2018  . Physical exam 03/21/2018  . Vitamin D deficiency 03/21/2018  . Cholelithiasis with cholecystitis 02/13/2015  . Hyperlipidemia 12/27/2014  . Pre-diabetes 12/25/2014  . Anxiety and depression 12/25/2014   Past Medical History:  Diagnosis Date  . Allergy   . Anxiety   . Arthritis    LEFT HIP   . Complication of anesthesia    SOME NAUSEA WITH PRIOR C SECTION  . Depression    Dr Mickle Plumb  . GERD (gastroesophageal reflux disease)   . PONV (postoperative nausea and vomiting)   . Pre-diabetes   . Prediabetes   . Wears glasses     Past Surgical History:  Procedure Laterality Date  . BREAST BIOPSY Right 1989    Benign  . CESAREAN SECTION  1994  . CHOLECYSTECTOMY N/A 02/17/2015   Procedure: LAPAROSCOPIC CHOLECYSTECTOMY WITH INTRAOPERATIVE CHOLANGIOGRAM;  Surgeon: Armandina Gemma, MD;  [facility];  Service: General;  Laterality: N/A;  . COLONOSCOPY WITH PROPOFOL  02-21-2013  . JOINT REPLACEMENT    . TOTAL HIP ARTHROPLASTY Left 06/20/2018   Procedure: LEFT TOTAL HIP ARTHROPLASTY ANTERIOR APPROACH;  Surgeon: Paralee Cancel, MD;  Location: [facility];  Service: Orthopedics;  Laterality: Left;  70 mins    Current Facility-Administered Medications  Medication Dose Route Frequency Provider Last Rate Last Admin  . ceFAZolin (ANCEF) IVPB 2g/100 mL premix  2 g Intravenous On Call to OR Babish, Rodman Key, PA-C      . dexamethasone (DECADRON) injection 10 mg  10 mg Intravenous Once Babish, Matthew, PA-C      . lactated ringers infusion   Intravenous Continuous Lidia Collum, MD 10 mL/hr at 07/08/20 0644 New Bag at 07/08/20 2878  . tranexamic acid (CYKLOKAPRON) IVPB 1,000 mg  1,000 mg Intravenous To OR Babish, Matthew, PA-C       No Known Allergies  Social History   Tobacco Use  . Smoking status: Never Smoker  . Smokeless tobacco: Never Used  Substance Use Topics  . Alcohol use: Yes    Comment: occasional    Family History  Problem Relation Age of Onset  . Arthritis Mother   . Hyperlipidemia Mother   . Hypertension Mother   . Depression Mother   . Arthritis Father   . Hyperlipidemia Father   . Hypertension Father   . Depression Father   . Atrial fibrillation Father   . Hypertension Sister   . Depression Sister   .  Arthritis Maternal Grandfather   . Hyperlipidemia Maternal Grandfather   . Arthritis Paternal Grandfather   . Hyperlipidemia Paternal Grandfather   . Hypertension Paternal Grandfather   . Aortic aneurysm Paternal Grandfather   . Arthritis Paternal Grandmother   . Hyperlipidemia Paternal Grandmother   . Hypertension Paternal Grandmother   . Breast cancer Maternal Aunt   . Hypertension Maternal Aunt   . Hyperlipidemia Maternal Aunt   . Cancer Maternal Aunt        breast  . Hyperlipidemia Paternal Aunt   . Heart disease Paternal Aunt   . Cancer Paternal Aunt         breast  . Congenital heart disease Paternal Aunt   . Hyperlipidemia Paternal Uncle   . Hyperlipidemia Maternal Aunt   . Hyperlipidemia Maternal Uncle   . Hypertension Maternal Aunt   . Hypertension Maternal Uncle   . Hypertension Paternal Aunt   . Hypertension Paternal Uncle   . Hyperlipidemia Paternal Uncle   . Cancer - Other Maternal Aunt        cns  . Cancer Maternal Grandmother        gallbladder  . Colon cancer Neg Hx   . Esophageal cancer Neg Hx   . Stomach cancer Neg Hx   . Rectal cancer Neg Hx      Review of Systems  Constitutional: Negative for chills and fever.  Respiratory: Negative for cough and shortness of breath.   Cardiovascular: Negative for chest pain.  Gastrointestinal: Negative for nausea and vomiting.  Musculoskeletal: Positive for arthralgias.    Objective:  Physical Exam Patient is a 59 year old female.  Well nourished and well developed. General: Alert and oriented x3, cooperative and pleasant, no acute distress. Head: normocephalic, atraumatic, neck supple. Eyes: EOMI. Respiratory: breath sounds clear in all fields, no wheezing, rales, or rhonchi. Cardiovascular: Regular rate and rhythm, no murmurs, gallops or rubs. Abdomen: non-tender to palpation and soft, normoactive bowel sounds. Musculoskeletal:  Right hip exam: Painful hip flexion internal rotation to 5 with pelvic tilting External rotation to about 20 Otherwise neurovascular intact Weakness with discomfort with active hip flexion and external rotation  Calves soft and nontender. Motor function intact in LE. Strength 5/5 LE bilaterally. Neuro: Distal pulses 2+. Sensation to light touch intact in LE.  Vital signs in last 24 hours: Temp:  [98.4 F (36.9 C)] 98.4 F (36.9 C) (08/17 0643) Pulse Rate:  [73] 73 (08/17 0643) Resp:  [18] 18 (08/17 0643) BP: (132)/(85) 132/85 (08/17 0643) SpO2:  [100 %] 100 % (08/17 0643)  Labs:   Estimated body mass index is 30.99 kg/m as  calculated from the following:   Height as of 06/25/20: 5\' 6"  (1.676 m).   Weight as of 06/25/20: 87.1 kg.   Imaging Review Plain radiographs demonstrate severe degenerative joint disease of the right hip(s). The bone quality appears to be adequate for age and reported activity level.  Assessment/Plan:  End stage arthritis, right hip(s)  The patient history, physical examination, clinical judgement of the provider and imaging studies are consistent with end stage degenerative joint disease of the right hip(s) and total hip arthroplasty is deemed medically necessary. The treatment options including medical management, injection therapy, arthroscopy and arthroplasty were discussed at length. The risks and benefits of total hip arthroplasty were presented and reviewed. The risks due to aseptic loosening, infection, stiffness, dislocation/subluxation,  thromboembolic complications and other imponderables were discussed.  The patient acknowledged the explanation, agreed to proceed with the plan and consent was  signed. Patient is being admitted for inpatient treatment for surgery, pain control, PT, OT, prophylactic antibiotics, VTE prophylaxis, progressive ambulation and ADL's and discharge planning.The patient is planning to be discharged home.  Therapy Plans: HEP Disposition: Home with husband Planned DVT Prophylaxis: aspirin 81mg  BID DME needed: none PCP: Dr. Birdie Riddle, clearance received TXA: IV Allergies: NKDA Anesthesia Concerns: post-op nausea and vomiting, even with spinal last time BMI: 30.2 Pre-diabetic, on metformin.  Other: Planning for same day discharge. Meds sent to pharmacy.  - Patient was instructed on what medications to stop prior to surgery. - Follow-up visit in 2 weeks with Dr. Alvan Dame - Begin physical therapy following surgery - Pre-operative lab work as pre-surgical testing - Prescriptions will be provided in hospital at time of discharge  Griffith Citron, PA-C Orthopedic  Surgery [facility] [place] [phone]

## 2020-07-08 NOTE — Discharge Instructions (Signed)

## 2020-07-08 NOTE — Interval H&P Note (Signed)
History and Physical Interval Note:  07/08/2020 8:51 AM  Tracy Pitts  has presented today for surgery, with the diagnosis of Right hip osteoarthritis.  The various methods of treatment have been discussed with the patient and family. After consideration of risks, benefits and other options for treatment, the patient has consented to  Procedure(s) with comments: TOTAL HIP ARTHROPLASTY ANTERIOR APPROACH (Right) - 70 mins as a surgical intervention.  The patient's history has been reviewed, patient examined, no change in status, stable for surgery.  I have reviewed the patient's chart and labs.  Questions were answered to the patient's satisfaction.     Mauri Pole

## 2020-07-09 ENCOUNTER — Encounter (HOSPITAL_COMMUNITY): Payer: Self-pay | Admitting: Orthopedic Surgery

## 2020-07-24 MED FILL — HYDROCODON-APAP 7.5-325: 7.5-325 | 5 days supply | Qty: 40 | Fill #0

## 2020-07-29 ENCOUNTER — Other Ambulatory Visit: Payer: Self-pay | Admitting: Family Medicine

## 2020-07-31 MED FILL — SERTRALINE HCL 100 MG TABS: 100 | 90 days supply | Qty: 90 | Fill #0

## 2020-08-28 MED FILL — METHOCARBAMOL 500 MG TABS: 500 | 12 days supply | Qty: 50 | Fill #1

## 2020-09-10 ENCOUNTER — Other Ambulatory Visit: Payer: Self-pay | Admitting: Family Medicine

## 2020-09-10 MED FILL — ATORVASTATIN CALCIUM 10 MG: 10 | 90 days supply | Qty: 90 | Fill #0

## 2020-09-17 ENCOUNTER — Other Ambulatory Visit: Payer: Self-pay | Admitting: Family Medicine

## 2020-09-17 MED FILL — PANTOPRAZOLE SOD DR 40 MG T: 40 | 90 days supply | Qty: 90 | Fill #0

## 2020-09-24 ENCOUNTER — Other Ambulatory Visit (HOSPITAL_COMMUNITY): Payer: Self-pay | Admitting: Orthopedic Surgery

## 2020-09-24 MED FILL — AMOXICILLIN 500 MG CAPSULE: 500 | 5 days supply | Qty: 20 | Fill #0

## 2020-09-30 ENCOUNTER — Other Ambulatory Visit: Payer: Self-pay

## 2020-09-30 MED ORDER — BUPROPION HCL ER (XL) 150 MG PO TB24: 150.0000 mg | ORAL_TABLET | Freq: Every day | ORAL | 0 refills | Status: DC

## 2020-09-30 MED FILL — buPROPion HCL ER (XL) 150 M: 150 | 30 days supply | Qty: 30 | Fill #0

## 2020-10-28 MED FILL — buPROPion HCL ER (XL) 150 M: 150 | 30 days supply | Qty: 30 | Fill #1

## 2020-10-28 MED FILL — SERTRALINE HCL 100 MG TABS: 100 | 30 days supply | Qty: 30 | Fill #0

## 2020-10-30 ENCOUNTER — Other Ambulatory Visit: Payer: Self-pay

## 2020-10-30 MED ORDER — SERTRALINE HCL 100 MG PO TABS: 100.0000 mg | ORAL_TABLET | Freq: Every day | ORAL | 3 refills | Status: DC

## 2020-11-28 ENCOUNTER — Encounter: Payer: Self-pay | Admitting: Family Medicine

## 2020-11-28 MED FILL — SERTRALINE HCL 100 MG TABS: 100 | 30 days supply | Qty: 30 | Fill #1

## 2020-11-28 MED FILL — buPROPion HCL ER (XL) 150 M: 150 | 90 days supply | Qty: 90 | Fill #0

## 2020-12-02 ENCOUNTER — Other Ambulatory Visit: Payer: Self-pay | Admitting: Emergency Medicine

## 2020-12-09 ENCOUNTER — Telehealth: Payer: Self-pay

## 2020-12-09 ENCOUNTER — Other Ambulatory Visit: Payer: Self-pay | Admitting: Family Medicine

## 2020-12-09 MED FILL — ATORVASTATIN CALCIUM 10 MG: 10 | 90 days supply | Qty: 90 | Fill #0

## 2020-12-09 NOTE — Telephone Encounter (Signed)
Called and left patient a message to return call to office to schedule a 6 month follow up with Dr. Birdie Riddle for Rx.

## 2020-12-10 ENCOUNTER — Other Ambulatory Visit: Payer: Self-pay | Admitting: Emergency Medicine

## 2020-12-10 DIAGNOSIS — F419 Anxiety disorder, unspecified: Secondary | ICD-10-CM

## 2020-12-10 MED ORDER — BUPROPION HCL ER (XL) 150 MG PO TB24: 150.0000 mg | ORAL_TABLET | Freq: Every day | ORAL | 1 refills | Status: DC

## 2020-12-26 MED FILL — SERTRALINE HCL 100 MG TABS: 100 | 30 days supply | Qty: 30 | Fill #2

## 2020-12-26 MED FILL — PANTOPRAZOLE SOD DR 40 MG T: 40 | 90 days supply | Qty: 90 | Fill #1

## 2020-12-27 ENCOUNTER — Other Ambulatory Visit (HOSPITAL_COMMUNITY): Payer: Self-pay | Admitting: Obstetrics and Gynecology

## 2020-12-27 MED FILL — METFORMIN HCL 500 MG TABS: 500 | 90 days supply | Qty: 180 | Fill #0

## 2021-01-12 ENCOUNTER — Encounter: Payer: Self-pay | Admitting: Family Medicine

## 2021-01-12 ENCOUNTER — Other Ambulatory Visit: Payer: Self-pay

## 2021-01-12 ENCOUNTER — Ambulatory Visit (INDEPENDENT_AMBULATORY_CARE_PROVIDER_SITE_OTHER): Payer: No Typology Code available for payment source | Admitting: Family Medicine

## 2021-01-12 VITALS — BP 126/80 | HR 68 | Temp 98.5°F | Resp 16 | Ht 66.0 in | Wt 201.6 lb

## 2021-01-12 DIAGNOSIS — F32A Depression, unspecified: Secondary | ICD-10-CM | POA: Diagnosis not present

## 2021-01-12 DIAGNOSIS — E785 Hyperlipidemia, unspecified: Secondary | ICD-10-CM | POA: Diagnosis not present

## 2021-01-12 DIAGNOSIS — F419 Anxiety disorder, unspecified: Secondary | ICD-10-CM

## 2021-01-12 DIAGNOSIS — E669 Obesity, unspecified: Secondary | ICD-10-CM

## 2021-01-12 LAB — LIPID PANEL
Cholesterol: 171 mg/dL (ref 0–200)
HDL: 73.7 mg/dL (ref >39.00)
LDL Cholesterol: 86 mg/dL (ref 0–99)
NonHDL: 97.19
Total CHOL/HDL Ratio: 2
Triglycerides: 57 mg/dL (ref 0.0–149.0)
VLDL: 11.4 mg/dL (ref 0.0–40.0)

## 2021-01-12 LAB — CBC WITH DIFFERENTIAL/PLATELET
Basophils Absolute: 0.1 10*3/uL (ref 0.0–0.1)
Basophils Relative: 1.1 % (ref 0.0–3.0)
Eosinophils Absolute: 0.1 10*3/uL (ref 0.0–0.7)
Eosinophils Relative: 2.3 % (ref 0.0–5.0)
HCT: 42.6 % (ref 36.0–46.0)
Hemoglobin: 14 g/dL (ref 12.0–15.0)
Lymphocytes Relative: 26.9 % (ref 12.0–46.0)
Lymphs Abs: 1.3 10*3/uL (ref 0.7–4.0)
MCHC: 33 g/dL (ref 30.0–36.0)
MCV: 94.3 fl (ref 78.0–100.0)
Monocytes Absolute: 0.4 10*3/uL (ref 0.1–1.0)
Monocytes Relative: 7.9 % (ref 3.0–12.0)
Neutro Abs: 3 10*3/uL (ref 1.4–7.7)
Neutrophils Relative %: 61.8 % (ref 43.0–77.0)
Platelets: 214 10*3/uL (ref 150.0–400.0)
RBC: 4.51 Mil/uL (ref 3.87–5.11)
RDW: 13.7 % (ref 11.5–15.5)
WBC: 4.8 10*3/uL (ref 4.0–10.5)

## 2021-01-12 LAB — BASIC METABOLIC PANEL
BUN: 20 mg/dL (ref 6–23)
CO2: 28 mEq/L (ref 19–32)
Calcium: 9.4 mg/dL (ref 8.4–10.5)
Chloride: 105 mEq/L (ref 96–112)
Creatinine, Ser: 0.8 mg/dL (ref 0.40–1.20)
GFR: 80.9 mL/min (ref >60.00)
Glucose, Bld: 83 mg/dL (ref 70–99)
Potassium: 4.3 mEq/L (ref 3.5–5.1)
Sodium: 141 mEq/L (ref 135–145)

## 2021-01-12 LAB — HEPATIC FUNCTION PANEL
ALT: 16 U/L (ref 0–35)
AST: 15 U/L (ref 0–37)
Albumin: 4.3 g/dL (ref 3.5–5.2)
Alkaline Phosphatase: 60 U/L (ref 39–117)
Bilirubin, Direct: 0 mg/dL (ref 0.0–0.3)
Total Bilirubin: 0.3 mg/dL (ref 0.2–1.2)
Total Protein: 6.9 g/dL (ref 6.0–8.3)

## 2021-01-12 LAB — TSH: TSH: 2.28 u[IU]/mL (ref 0.35–4.50)

## 2021-01-12 MED ORDER — SERTRALINE HCL 100 MG PO TABS: 100.0000 mg | ORAL_TABLET | Freq: Every day | ORAL | 1 refills | Status: DC

## 2021-01-12 MED ORDER — SERTRALINE HCL 100 MG PO TABS: 100.0000 mg | ORAL_TABLET | Freq: Every day | ORAL | 0 refills | Status: DC

## 2021-01-12 NOTE — Patient Instructions (Addendum)
Schedule your complete physical this summer (after June 28th) We'll notify you of your lab results and make any changes if needed Keep up the good work on healthy diet and regular exercise- you're doing great! Call with any questions or concerns Stay Safe!  Stay Healthy!!! Happy Early Rudene Anda!!!

## 2021-01-12 NOTE — Progress Notes (Signed)
   Subjective:    Patient ID: Tracy Pitts, female    DOB: 08-Dec-1960, 60 y.o.   MRN: [id number]  HPI Hyperlipidemia- chronic problem, on Lipitor 10mg  daily and Red Yeast Rice.  No CP, SOB, abd pain, N/V.  Obesity- pt has gained 8 lbs since last visit.  Pt reports she is walking regularly.  Not following a particular diet  Anxiety/Depression- ongoing issue for pt.  On Wellbutrin 150mg  daily and Sertraline 100mg  daily.  Pt reports mood is 'pretty good'.  Pt is finding ways to protect herself and wall herself off to the constant negativity.   Review of Systems For ROS see HPI   This visit occurred during the SARS-CoV-2 public health emergency.  Safety protocols were in place, including screening questions prior to the visit, additional usage of staff PPE, and extensive cleaning of exam room while observing appropriate contact time as indicated for disinfecting solutions.       Objective:   Physical Exam Vitals reviewed.  Constitutional:      General: She is not in acute distress.    Appearance: Normal appearance. She is well-developed and well-nourished. She is not ill-appearing.  HENT:     Head: Normocephalic and atraumatic.  Eyes:     Extraocular Movements: EOM normal.     Conjunctiva/sclera: Conjunctivae normal.     Pupils: Pupils are equal, round, and reactive to light.  Neck:     Thyroid: No thyromegaly.  Cardiovascular:     Rate and Rhythm: Normal rate and regular rhythm.     Pulses: Normal pulses and intact distal pulses.     Heart sounds: Normal heart sounds. No murmur heard.   Pulmonary:     Effort: Pulmonary effort is normal. No respiratory distress.     Breath sounds: Normal breath sounds.  Abdominal:     General: There is no distension.     Palpations: Abdomen is soft.     Tenderness: There is no abdominal tenderness.  Musculoskeletal:        General: No edema.     Cervical back: Normal range of motion and neck supple.     Right lower leg: No edema.      Left lower leg: No edema.  Lymphadenopathy:     Cervical: No cervical adenopathy.  Skin:    General: Skin is warm and dry.  Neurological:     Mental Status: She is alert and oriented to person, place, and time.  Psychiatric:        Mood and Affect: Mood and affect normal.        Behavior: Behavior normal.           Assessment & Plan:

## 2021-01-12 NOTE — Assessment & Plan Note (Signed)
Chronic problem.  Currently well controlled.  On Wellbutrin 150mg  daily and Sertraline 100mg  daily.  No med changes at this time.  Will follow.

## 2021-01-12 NOTE — Assessment & Plan Note (Signed)
Chronic problem.  Tolerating Lipitor and Red Yeast Rice w/o difficulty.  Check labs.  Adjust meds prn  °

## 2021-01-12 NOTE — Assessment & Plan Note (Signed)
Chronic problem.  Pt has gained 8 lbs after surgery this summer.  Encouraged healthy diet and regular exercise.  Will continue to follow.

## 2021-01-23 MED FILL — SERTRALINE HCL 100 MG TABS: 100 | 30 days supply | Qty: 30 | Fill #3

## 2021-02-10 ENCOUNTER — Other Ambulatory Visit (HOSPITAL_BASED_OUTPATIENT_CLINIC_OR_DEPARTMENT_OTHER): Payer: Self-pay

## 2021-02-22 ENCOUNTER — Other Ambulatory Visit (HOSPITAL_COMMUNITY): Payer: Self-pay

## 2021-03-02 ENCOUNTER — Other Ambulatory Visit (HOSPITAL_COMMUNITY): Payer: Self-pay

## 2021-03-02 MED FILL — Sertraline HCl Tab 100 MG: ORAL | 90 days supply | Qty: 90 | Fill #0 | Status: AC

## 2021-03-03 ENCOUNTER — Other Ambulatory Visit (HOSPITAL_COMMUNITY): Payer: Self-pay

## 2021-03-04 ENCOUNTER — Other Ambulatory Visit (HOSPITAL_COMMUNITY): Payer: Self-pay

## 2021-03-04 ENCOUNTER — Other Ambulatory Visit (HOSPITAL_BASED_OUTPATIENT_CLINIC_OR_DEPARTMENT_OTHER): Payer: Self-pay

## 2021-03-04 MED FILL — Bupropion HCl Tab ER 24HR 150 MG: ORAL | 90 days supply | Qty: 90 | Fill #0 | Status: AC

## 2021-03-05 ENCOUNTER — Other Ambulatory Visit (HOSPITAL_COMMUNITY): Payer: Self-pay

## 2021-03-13 ENCOUNTER — Other Ambulatory Visit: Payer: Self-pay | Admitting: Family Medicine

## 2021-03-13 ENCOUNTER — Other Ambulatory Visit (HOSPITAL_COMMUNITY): Payer: Self-pay

## 2021-03-13 MED ORDER — ATORVASTATIN CALCIUM 10 MG PO TABS
ORAL_TABLET | Freq: Every day | ORAL | 0 refills | Status: DC
  Filled 2021-03-13: qty 90, 90d supply, fill #0

## 2021-03-16 ENCOUNTER — Other Ambulatory Visit (HOSPITAL_COMMUNITY): Payer: Self-pay

## 2021-03-31 ENCOUNTER — Other Ambulatory Visit (HOSPITAL_COMMUNITY): Payer: Self-pay

## 2021-03-31 ENCOUNTER — Other Ambulatory Visit: Payer: Self-pay | Admitting: Family Medicine

## 2021-03-31 MED ORDER — PANTOPRAZOLE SODIUM 40 MG PO TBEC
DELAYED_RELEASE_TABLET | Freq: Every day | ORAL | 1 refills | Status: DC
  Filled 2021-03-31: qty 90, 90d supply, fill #0
  Filled 2021-06-12: qty 90, 90d supply, fill #1

## 2021-04-28 ENCOUNTER — Other Ambulatory Visit (HOSPITAL_COMMUNITY): Payer: Self-pay

## 2021-04-28 MED FILL — Amoxicillin (Trihydrate) Cap 500 MG: ORAL | 5 days supply | Qty: 20 | Fill #0 | Status: AC

## 2021-05-20 ENCOUNTER — Encounter: Payer: Self-pay | Admitting: *Deleted

## 2021-05-21 ENCOUNTER — Encounter: Payer: Self-pay | Admitting: Family Medicine

## 2021-05-21 ENCOUNTER — Ambulatory Visit (INDEPENDENT_AMBULATORY_CARE_PROVIDER_SITE_OTHER): Payer: No Typology Code available for payment source | Admitting: Family Medicine

## 2021-05-21 ENCOUNTER — Other Ambulatory Visit: Payer: Self-pay

## 2021-05-21 VITALS — BP 130/80 | HR 68 | Temp 98.4°F | Resp 18 | Ht 66.0 in | Wt 204.8 lb

## 2021-05-21 DIAGNOSIS — Z0000 Encounter for general adult medical examination without abnormal findings: Secondary | ICD-10-CM

## 2021-05-21 DIAGNOSIS — E669 Obesity, unspecified: Secondary | ICD-10-CM

## 2021-05-21 DIAGNOSIS — Z1159 Encounter for screening for other viral diseases: Secondary | ICD-10-CM

## 2021-05-21 DIAGNOSIS — E559 Vitamin D deficiency, unspecified: Secondary | ICD-10-CM

## 2021-05-21 DIAGNOSIS — Z114 Encounter for screening for human immunodeficiency virus [HIV]: Secondary | ICD-10-CM

## 2021-05-21 LAB — BASIC METABOLIC PANEL
BUN: 21 mg/dL (ref 6–23)
CO2: 27 mEq/L (ref 19–32)
Calcium: 9.4 mg/dL (ref 8.4–10.5)
Chloride: 103 mEq/L (ref 96–112)
Creatinine, Ser: 0.82 mg/dL (ref 0.40–1.20)
GFR: 78.34 mL/min (ref >60.00)
Glucose, Bld: 84 mg/dL (ref 70–99)
Potassium: 4.5 mEq/L (ref 3.5–5.1)
Sodium: 139 mEq/L (ref 135–145)

## 2021-05-21 LAB — CBC WITH DIFFERENTIAL/PLATELET
Basophils Absolute: 0.1 10*3/uL (ref 0.0–0.1)
Basophils Relative: 1.3 % (ref 0.0–3.0)
Eosinophils Absolute: 0.1 10*3/uL (ref 0.0–0.7)
Eosinophils Relative: 1.7 % (ref 0.0–5.0)
HCT: 42.3 % (ref 36.0–46.0)
Hemoglobin: 14 g/dL (ref 12.0–15.0)
Lymphocytes Relative: 28.4 % (ref 12.0–46.0)
Lymphs Abs: 1.2 10*3/uL (ref 0.7–4.0)
MCHC: 33.1 g/dL (ref 30.0–36.0)
MCV: 94.1 fl (ref 78.0–100.0)
Monocytes Absolute: 0.4 10*3/uL (ref 0.1–1.0)
Monocytes Relative: 9.1 % (ref 3.0–12.0)
Neutro Abs: 2.5 10*3/uL (ref 1.4–7.7)
Neutrophils Relative %: 59.5 % (ref 43.0–77.0)
Platelets: 200 10*3/uL (ref 150.0–400.0)
RBC: 4.5 Mil/uL (ref 3.87–5.11)
RDW: 13.2 % (ref 11.5–15.5)
WBC: 4.1 10*3/uL (ref 4.0–10.5)

## 2021-05-21 LAB — HEPATIC FUNCTION PANEL
ALT: 19 U/L (ref 0–35)
AST: 16 U/L (ref 0–37)
Albumin: 4.5 g/dL (ref 3.5–5.2)
Alkaline Phosphatase: 70 U/L (ref 39–117)
Bilirubin, Direct: 0.1 mg/dL (ref 0.0–0.3)
Total Bilirubin: 0.4 mg/dL (ref 0.2–1.2)
Total Protein: 6.8 g/dL (ref 6.0–8.3)

## 2021-05-21 LAB — LIPID PANEL
Cholesterol: 193 mg/dL (ref 0–200)
HDL: 79.8 mg/dL (ref >39.00)
LDL Cholesterol: 101 mg/dL — ABNORMAL HIGH (ref 0–99)
NonHDL: 113.19
Total CHOL/HDL Ratio: 2
Triglycerides: 59 mg/dL (ref 0.0–149.0)
VLDL: 11.8 mg/dL (ref 0.0–40.0)

## 2021-05-21 LAB — VITAMIN D 25 HYDROXY (VIT D DEFICIENCY, FRACTURES): VITD: 32.86 ng/mL (ref 30.00–100.00)

## 2021-05-21 LAB — TSH: TSH: 2.41 u[IU]/mL (ref 0.35–5.50)

## 2021-05-21 NOTE — Patient Instructions (Addendum)
Follow up in 6 months to recheck cholesterol We'll notify you of your lab results and make any changes if needed Continue to work on healthy diet and regular exercise- you can do it! Consider swimming to help w/ the joints IF you want to proceed w/ neuropsych testing for word finding issues- let me know! Call with any questions or concerns Stay Safe!  Stay Healthy! Have a great summer!!

## 2021-05-21 NOTE — Assessment & Plan Note (Signed)
Check labs and replete prn. 

## 2021-05-21 NOTE — Assessment & Plan Note (Signed)
Pt's PE WNL w/ exception of obesity.  UTD on pap, mammo, colonoscopy, immunizations.  Check labs.  Anticipatory guidance provided.

## 2021-05-21 NOTE — Progress Notes (Signed)
Subjective:    Patient ID: Tracy Pitts, female    DOB: 02-22-1961, 60 y.o.   MRN: [id number]  HPI CPE- UTD on pap, colonoscopy, mammo, Tdap, COVID.  Reviewed past medical, surgical, family and social histories.   Patient Care Team    Relationship Specialty Notifications Start End  Midge Minium, MD PCP - General Family Medicine  05/20/17   Sueanne Margarita, MD PCP - Cardiology Cardiology Admissions 06/08/18   Paula Compton, MD Consulting Physician Obstetrics and Gynecology  05/20/17   Irene Shipper, MD Consulting Physician Gastroenterology  05/20/17     Health Maintenance  Topic Date Due   COVID-19 Vaccine (3 - Booster for Pfizer series) 11/21/2021 (Originally 11/10/2020)   Zoster Vaccines- Shingrix (1 of 2) 11/21/2021 (Originally 01/19/2012)   Hepatitis C Screening  01/12/2022 (Originally 01/19/1980)   HIV Screening  01/12/2022 (Originally 01/18/1977)   INFLUENZA VACCINE  06/22/2021   PAP SMEAR-Modifier  05/28/2022   MAMMOGRAM  06/10/2022   COLONOSCOPY (Pts 45-17yrs Insurance coverage will need to be confirmed)  02/22/2023   TETANUS/TDAP  02/18/2027   Pneumococcal Vaccine 66-32 Years old  Aged Out   HPV VACCINES  Aged Out      Review of Systems Patient reports no vision/ hearing changes, adenopathy,fever, weight change,  persistant/recurrent hoarseness , swallowing issues, chest pain, palpitations, edema, persistant/recurrent cough, hemoptysis, dyspnea (rest/exertional/paroxysmal nocturnal), gastrointestinal bleeding (melena, rectal bleeding), abdominal pain, significant heartburn, bowel changes, GU symptoms (dysuria, hematuria, incontinence), Gyn symptoms (abnormal  bleeding, pain),  syncope, focal weakness, memory loss, numbness & tingling, skin/hair/nail changes, abnormal bruising or bleeding, anxiety, or depression.   + word finding difficulty  This visit occurred during the SARS-CoV-2 public health emergency.  Safety protocols were in place, including screening  questions prior to the visit, additional usage of staff PPE, and extensive cleaning of exam room while observing appropriate contact time as indicated for disinfecting solutions.      Objective:   Physical Exam General Appearance:    Alert, cooperative, no distress, appears stated age, obese  Head:    Normocephalic, without obvious abnormality, atraumatic  Eyes:    PERRL, conjunctiva/corneas clear, EOM's intact, fundi    benign, both eyes  Ears:    Normal TM's and external ear canals, both ears  Nose:   Nares normal, septum midline, mucosa normal, no drainage    or sinus tenderness  Throat:   Lips, mucosa, and tongue normal; teeth and gums normal  Neck:   Supple, symmetrical, trachea midline, no adenopathy;    Thyroid: no enlargement/tenderness/nodules  Back:     Symmetric, no curvature, ROM normal, no CVA tenderness  Lungs:     Clear to auscultation bilaterally, respirations unlabored  Chest Wall:    No tenderness or deformity   Heart:    Regular rate and rhythm, S1 and S2 normal, no murmur, rub   or gallop  Breast Exam:    Deferred to GYN  Abdomen:     Soft, non-tender, bowel sounds active all four quadrants,    no masses, no organomegaly  Genitalia:    Deferred to GYN  Rectal:    Extremities:   Extremities normal, atraumatic, no cyanosis or edema  Pulses:   2+ and symmetric all extremities  Skin:   Skin color, texture, turgor normal, no rashes or lesions  Lymph nodes:   Cervical, supraclavicular, and axillary nodes normal  Neurologic:   CNII-XII intact, normal strength, sensation and reflexes    throughout  Assessment & Plan:

## 2021-05-21 NOTE — Assessment & Plan Note (Signed)
Pt's BMI is 33.  She has had both hips replaced and is walking regularly but not comfortable w/ more aggressive exercise at this time.  Discussed swimming as an option.  Check labs to risk stratify.  Will follow.

## 2021-05-22 LAB — HIV ANTIBODY (ROUTINE TESTING W REFLEX)
HIV 1&2 Ab, 4th Generation: NONREACTIVE
HIV 1&2 Ab, 4th Generation: NONREACTIVE

## 2021-05-22 LAB — HEPATITIS C ANTIBODY
Hepatitis C Ab: NONREACTIVE
Hepatitis C Ab: NONREACTIVE
SIGNAL TO CUT-OFF: 0.01 (ref <1.00)
SIGNAL TO CUT-OFF: 0.01 (ref <1.00)

## 2021-05-28 ENCOUNTER — Other Ambulatory Visit (HOSPITAL_COMMUNITY): Payer: Self-pay

## 2021-05-28 MED FILL — Sertraline HCl Tab 100 MG: ORAL | 90 days supply | Qty: 90 | Fill #1 | Status: AC

## 2021-05-28 MED FILL — Bupropion HCl Tab ER 24HR 150 MG: ORAL | 90 days supply | Qty: 90 | Fill #1 | Status: AC

## 2021-06-12 ENCOUNTER — Other Ambulatory Visit: Payer: Self-pay | Admitting: Family Medicine

## 2021-06-12 ENCOUNTER — Other Ambulatory Visit (HOSPITAL_COMMUNITY): Payer: Self-pay

## 2021-06-12 MED ORDER — ATORVASTATIN CALCIUM 10 MG PO TABS
ORAL_TABLET | Freq: Every day | ORAL | 0 refills | Status: DC
  Filled 2021-06-12: qty 90, 90d supply, fill #0

## 2021-07-09 ENCOUNTER — Other Ambulatory Visit (HOSPITAL_COMMUNITY): Payer: Self-pay

## 2021-07-09 ENCOUNTER — Other Ambulatory Visit: Payer: Self-pay

## 2021-07-09 ENCOUNTER — Other Ambulatory Visit (HOSPITAL_BASED_OUTPATIENT_CLINIC_OR_DEPARTMENT_OTHER): Payer: Self-pay

## 2021-07-10 ENCOUNTER — Other Ambulatory Visit (HOSPITAL_COMMUNITY): Payer: Self-pay

## 2021-07-10 ENCOUNTER — Other Ambulatory Visit: Payer: Self-pay

## 2021-07-10 ENCOUNTER — Telehealth: Payer: Self-pay

## 2021-07-10 MED ORDER — METFORMIN HCL 500 MG PO TABS
500.0000 mg | ORAL_TABLET | Freq: Every day | ORAL | 1 refills | Status: DC
  Filled 2021-07-10: qty 90, 90d supply, fill #0
  Filled 2021-09-04 – 2021-09-15 (×2): qty 90, 90d supply, fill #1

## 2021-07-10 NOTE — Telephone Encounter (Signed)
Medication sent to patient's pharmacy.

## 2021-07-10 NOTE — Telephone Encounter (Signed)
Pt needs refill on metFORMIN (GLUCOPHAGE) 500 MG tablet  sent to [facility], [address]  [place], [place] [address]   Past Phy 06/22 Future appt 01/23  Pt call back [phone]

## 2021-08-11 ENCOUNTER — Telehealth: Payer: Self-pay

## 2021-08-11 NOTE — Telephone Encounter (Signed)
Lvm for patient to call me back in regards to [facility] paper work we received for her to take care of her husband

## 2021-09-03 LAB — HM PAP SMEAR

## 2021-09-03 LAB — HM MAMMOGRAPHY

## 2021-09-04 ENCOUNTER — Other Ambulatory Visit (HOSPITAL_COMMUNITY): Payer: Self-pay

## 2021-09-04 ENCOUNTER — Other Ambulatory Visit: Payer: Self-pay | Admitting: Family Medicine

## 2021-09-04 DIAGNOSIS — F419 Anxiety disorder, unspecified: Secondary | ICD-10-CM

## 2021-09-04 DIAGNOSIS — F32A Depression, unspecified: Secondary | ICD-10-CM

## 2021-09-04 MED ORDER — BUPROPION HCL ER (XL) 150 MG PO TB24
ORAL_TABLET | Freq: Every day | ORAL | 1 refills | Status: DC
Start: 2021-09-04 — End: 2021-12-03
  Filled 2021-09-04: qty 90, 90d supply, fill #0

## 2021-09-04 MED ORDER — SERTRALINE HCL 100 MG PO TABS
ORAL_TABLET | ORAL | 1 refills | Status: DC
  Filled 2021-09-04: qty 90, 90d supply, fill #0
  Filled 2021-12-04: qty 90, 90d supply, fill #1

## 2021-09-04 NOTE — Telephone Encounter (Signed)
UTD on visits. Please escribe. Thanks!

## 2021-09-10 ENCOUNTER — Encounter: Payer: Self-pay | Admitting: Family Medicine

## 2021-09-15 ENCOUNTER — Other Ambulatory Visit (HOSPITAL_COMMUNITY): Payer: Self-pay

## 2021-09-18 ENCOUNTER — Other Ambulatory Visit (HOSPITAL_COMMUNITY): Payer: Self-pay

## 2021-09-21 ENCOUNTER — Other Ambulatory Visit: Payer: Self-pay | Admitting: Family Medicine

## 2021-09-21 ENCOUNTER — Other Ambulatory Visit (HOSPITAL_COMMUNITY): Payer: Self-pay

## 2021-09-21 MED ORDER — ATORVASTATIN CALCIUM 10 MG PO TABS
ORAL_TABLET | Freq: Every day | ORAL | 0 refills | Status: DC
  Filled 2021-09-21: qty 90, 90d supply, fill #0

## 2021-09-29 ENCOUNTER — Other Ambulatory Visit: Payer: Self-pay | Admitting: Family Medicine

## 2021-09-29 ENCOUNTER — Other Ambulatory Visit (HOSPITAL_COMMUNITY): Payer: Self-pay

## 2021-09-29 MED ORDER — PANTOPRAZOLE SODIUM 40 MG PO TBEC
DELAYED_RELEASE_TABLET | Freq: Every day | ORAL | 1 refills | Status: DC
  Filled 2021-09-29: qty 90, 90d supply, fill #0
  Filled 2021-12-04: qty 90, 90d supply, fill #1

## 2021-11-26 ENCOUNTER — Ambulatory Visit: Payer: No Typology Code available for payment source | Admitting: Family Medicine

## 2021-12-03 ENCOUNTER — Other Ambulatory Visit (HOSPITAL_COMMUNITY): Payer: Self-pay

## 2021-12-03 ENCOUNTER — Ambulatory Visit (INDEPENDENT_AMBULATORY_CARE_PROVIDER_SITE_OTHER): Payer: No Typology Code available for payment source | Admitting: Family Medicine

## 2021-12-03 ENCOUNTER — Encounter: Payer: Self-pay | Admitting: Family Medicine

## 2021-12-03 VITALS — BP 130/80 | HR 67 | Temp 97.5°F | Resp 16 | Wt 215.2 lb

## 2021-12-03 DIAGNOSIS — F419 Anxiety disorder, unspecified: Secondary | ICD-10-CM

## 2021-12-03 DIAGNOSIS — F32A Depression, unspecified: Secondary | ICD-10-CM | POA: Diagnosis not present

## 2021-12-03 DIAGNOSIS — E669 Obesity, unspecified: Secondary | ICD-10-CM

## 2021-12-03 DIAGNOSIS — E785 Hyperlipidemia, unspecified: Secondary | ICD-10-CM | POA: Diagnosis not present

## 2021-12-03 LAB — BASIC METABOLIC PANEL
BUN: 25 mg/dL — ABNORMAL HIGH (ref 6–23)
CO2: 28 mEq/L (ref 19–32)
Calcium: 9 mg/dL (ref 8.4–10.5)
Chloride: 106 mEq/L (ref 96–112)
Creatinine, Ser: 0.86 mg/dL (ref 0.40–1.20)
GFR: 73.71 mL/min (ref >60.00)
Glucose, Bld: 88 mg/dL (ref 70–99)
Potassium: 4.6 mEq/L (ref 3.5–5.1)
Sodium: 140 mEq/L (ref 135–145)

## 2021-12-03 LAB — CBC WITH DIFFERENTIAL/PLATELET
Basophils Absolute: 0 10*3/uL (ref 0.0–0.1)
Basophils Relative: 0.8 % (ref 0.0–3.0)
Eosinophils Absolute: 0.1 10*3/uL (ref 0.0–0.7)
Eosinophils Relative: 1.9 % (ref 0.0–5.0)
HCT: 42.9 % (ref 36.0–46.0)
Hemoglobin: 13.8 g/dL (ref 12.0–15.0)
Lymphocytes Relative: 30.5 % (ref 12.0–46.0)
Lymphs Abs: 1.5 10*3/uL (ref 0.7–4.0)
MCHC: 32.2 g/dL (ref 30.0–36.0)
MCV: 94.9 fl (ref 78.0–100.0)
Monocytes Absolute: 0.5 10*3/uL (ref 0.1–1.0)
Monocytes Relative: 9.7 % (ref 3.0–12.0)
Neutro Abs: 2.9 10*3/uL (ref 1.4–7.7)
Neutrophils Relative %: 57.1 % (ref 43.0–77.0)
Platelets: 202 10*3/uL (ref 150.0–400.0)
RBC: 4.52 Mil/uL (ref 3.87–5.11)
RDW: 13.5 % (ref 11.5–15.5)
WBC: 5 10*3/uL (ref 4.0–10.5)

## 2021-12-03 LAB — LIPID PANEL
Cholesterol: 180 mg/dL (ref 0–200)
HDL: 73.3 mg/dL (ref >39.00)
LDL Cholesterol: 95 mg/dL (ref 0–99)
NonHDL: 106.74
Total CHOL/HDL Ratio: 2
Triglycerides: 57 mg/dL (ref 0.0–149.0)
VLDL: 11.4 mg/dL (ref 0.0–40.0)

## 2021-12-03 LAB — TSH: TSH: 1.95 u[IU]/mL (ref 0.35–5.50)

## 2021-12-03 LAB — HEPATIC FUNCTION PANEL
ALT: 20 U/L (ref 0–35)
AST: 16 U/L (ref 0–37)
Albumin: 4.2 g/dL (ref 3.5–5.2)
Alkaline Phosphatase: 58 U/L (ref 39–117)
Bilirubin, Direct: 0.1 mg/dL (ref 0.0–0.3)
Total Bilirubin: 0.4 mg/dL (ref 0.2–1.2)
Total Protein: 6.8 g/dL (ref 6.0–8.3)

## 2021-12-03 MED ORDER — ALPRAZOLAM 0.5 MG PO TABS
0.5000 mg | ORAL_TABLET | Freq: Two times a day (BID) | ORAL | 1 refills | Status: DC | PRN
  Filled 2021-12-03: qty 30, 15d supply, fill #0
  Filled 2022-02-10: qty 30, 15d supply, fill #1

## 2021-12-03 MED ORDER — BUPROPION HCL ER (XL) 300 MG PO TB24
300.0000 mg | ORAL_TABLET | Freq: Every day | ORAL | 3 refills | Status: DC
Start: 2021-12-03 — End: 2022-03-26
  Filled 2021-12-03: qty 90, 90d supply, fill #0
  Filled 2022-02-22: qty 30, 30d supply, fill #1

## 2021-12-03 NOTE — Assessment & Plan Note (Signed)
Deteriorated.  Pt has gained 11 lbs since last visit due to stress eating.  Encouraged her to give herself some grace in this situation and focus on self care.  Encouraged daily walking to clear her head.  She is already trying to change her diet.  Will follow.

## 2021-12-03 NOTE — Patient Instructions (Addendum)
Follow up in 4-6 weeks to recheck mood and sleep We'll notify you of your lab results and make any changes if needed Make sure you give yourself some grace!!! Continue to work on healthy diet and regular exercise- you're doing great!! INCREASE the Wellbutrin to 300mg  daily- 2 of what you have at home and 1 of the new prescription Use the Alprazolam as needed in times of panic Call with any questions or concerns Hang in there!!

## 2021-12-03 NOTE — Assessment & Plan Note (Signed)
Deteriorated.  Husband recently dx'd w/ Stage 4 lung cancer.  This is understandably devastating and pt is having a hard time w/ depression, anxiety, and sleep.  Will increase Wellbutrin to 300mg  daily.  Continue Sertraline at 100mg  daily.  Add Alprazolam for panicked moments.  Hopefully as anxiety improves, sleep will naturally improve.  But if not, we will add sleep medication at next visit.  Pt expressed understanding and is in agreement w/ plan.

## 2021-12-03 NOTE — Assessment & Plan Note (Signed)
Chronic problem.  Tolerating Lipitor and Red Yeast Rice w/o difficulty.  Check labs.  Adjust meds prn

## 2021-12-03 NOTE — Progress Notes (Signed)
° °  Subjective:    Patient ID: Tracy Pitts, female    DOB: 11-09-1961, 61 y.o.   MRN: [id number]  HPI Hyperlipidemia- chronic problem, on Lipitor 10mg  and Red Yeast Rice w/o difficulty.  Denies CP, SOB, abd pain, N/V.  Has been walking regularly and tried to decrease fat in diet and increasing veggie intake.  Anxiety/Depression- chronic problem, currently on Wellbutrin 150mg  daily and Sertraline 100mg  daily.  Sxs are worse since husband was dx'd w/ Stage 4 lung cancer.  Having panic attacks regularly.  Having trouble both falling asleep and staying asleep.  Obesity- ongoing issue for pt.  Has gained 11 lbs since last visit.  BMI 34.73.   Review of Systems For ROS see HPI   This visit occurred during the SARS-CoV-2 public health emergency.  Safety protocols were in place, including screening questions prior to the visit, additional usage of staff PPE, and extensive cleaning of exam room while observing appropriate contact time as indicated for disinfecting solutions.      Objective:   Physical Exam Vitals reviewed.  Constitutional:      General: She is not in acute distress.    Appearance: Normal appearance. She is well-developed. She is obese. She is not ill-appearing.  HENT:     Head: Normocephalic and atraumatic.  Eyes:     Conjunctiva/sclera: Conjunctivae normal.     Pupils: Pupils are equal, round, and reactive to light.  Neck:     Thyroid: No thyromegaly.  Cardiovascular:     Rate and Rhythm: Normal rate and regular rhythm.     Pulses: Normal pulses.     Heart sounds: Normal heart sounds. No murmur heard. Pulmonary:     Effort: Pulmonary effort is normal. No respiratory distress.     Breath sounds: Normal breath sounds.  Abdominal:     General: There is no distension.     Palpations: Abdomen is soft.     Tenderness: There is no abdominal tenderness.  Musculoskeletal:     Cervical back: Normal range of motion and neck supple.     Right lower leg: No edema.     Left  lower leg: No edema.  Lymphadenopathy:     Cervical: No cervical adenopathy.  Skin:    General: Skin is warm and dry.  Neurological:     General: No focal deficit present.     Mental Status: She is alert and oriented to person, place, and time.  Psychiatric:        Mood and Affect: Mood normal.        Behavior: Behavior normal.          Assessment & Plan:

## 2021-12-04 ENCOUNTER — Other Ambulatory Visit: Payer: Self-pay | Admitting: Family Medicine

## 2021-12-05 ENCOUNTER — Other Ambulatory Visit: Payer: Self-pay | Admitting: Family Medicine

## 2021-12-05 ENCOUNTER — Other Ambulatory Visit (HOSPITAL_COMMUNITY): Payer: Self-pay

## 2021-12-07 ENCOUNTER — Other Ambulatory Visit (HOSPITAL_COMMUNITY): Payer: Self-pay

## 2021-12-07 MED ORDER — METFORMIN HCL 500 MG PO TABS
500.0000 mg | ORAL_TABLET | Freq: Every day | ORAL | 1 refills | Status: DC
  Filled 2021-12-07: qty 90, 90d supply, fill #0
  Filled 2022-02-22: qty 90, 90d supply, fill #1

## 2021-12-07 MED ORDER — ATORVASTATIN CALCIUM 10 MG PO TABS
ORAL_TABLET | Freq: Every day | ORAL | 0 refills | Status: DC
  Filled 2021-12-07: qty 90, 90d supply, fill #0

## 2021-12-10 ENCOUNTER — Other Ambulatory Visit (HOSPITAL_COMMUNITY): Payer: Self-pay

## 2021-12-14 IMAGING — DX DG PORTABLE PELVIS
1 series · 1 of 1 positions shown · non-contrast
Comparison: 06/20/2018

CLINICAL DATA: Right hip replacement

EXAM:
PORTABLE PELVIS 1-2 VIEWS

[pelvis ap]
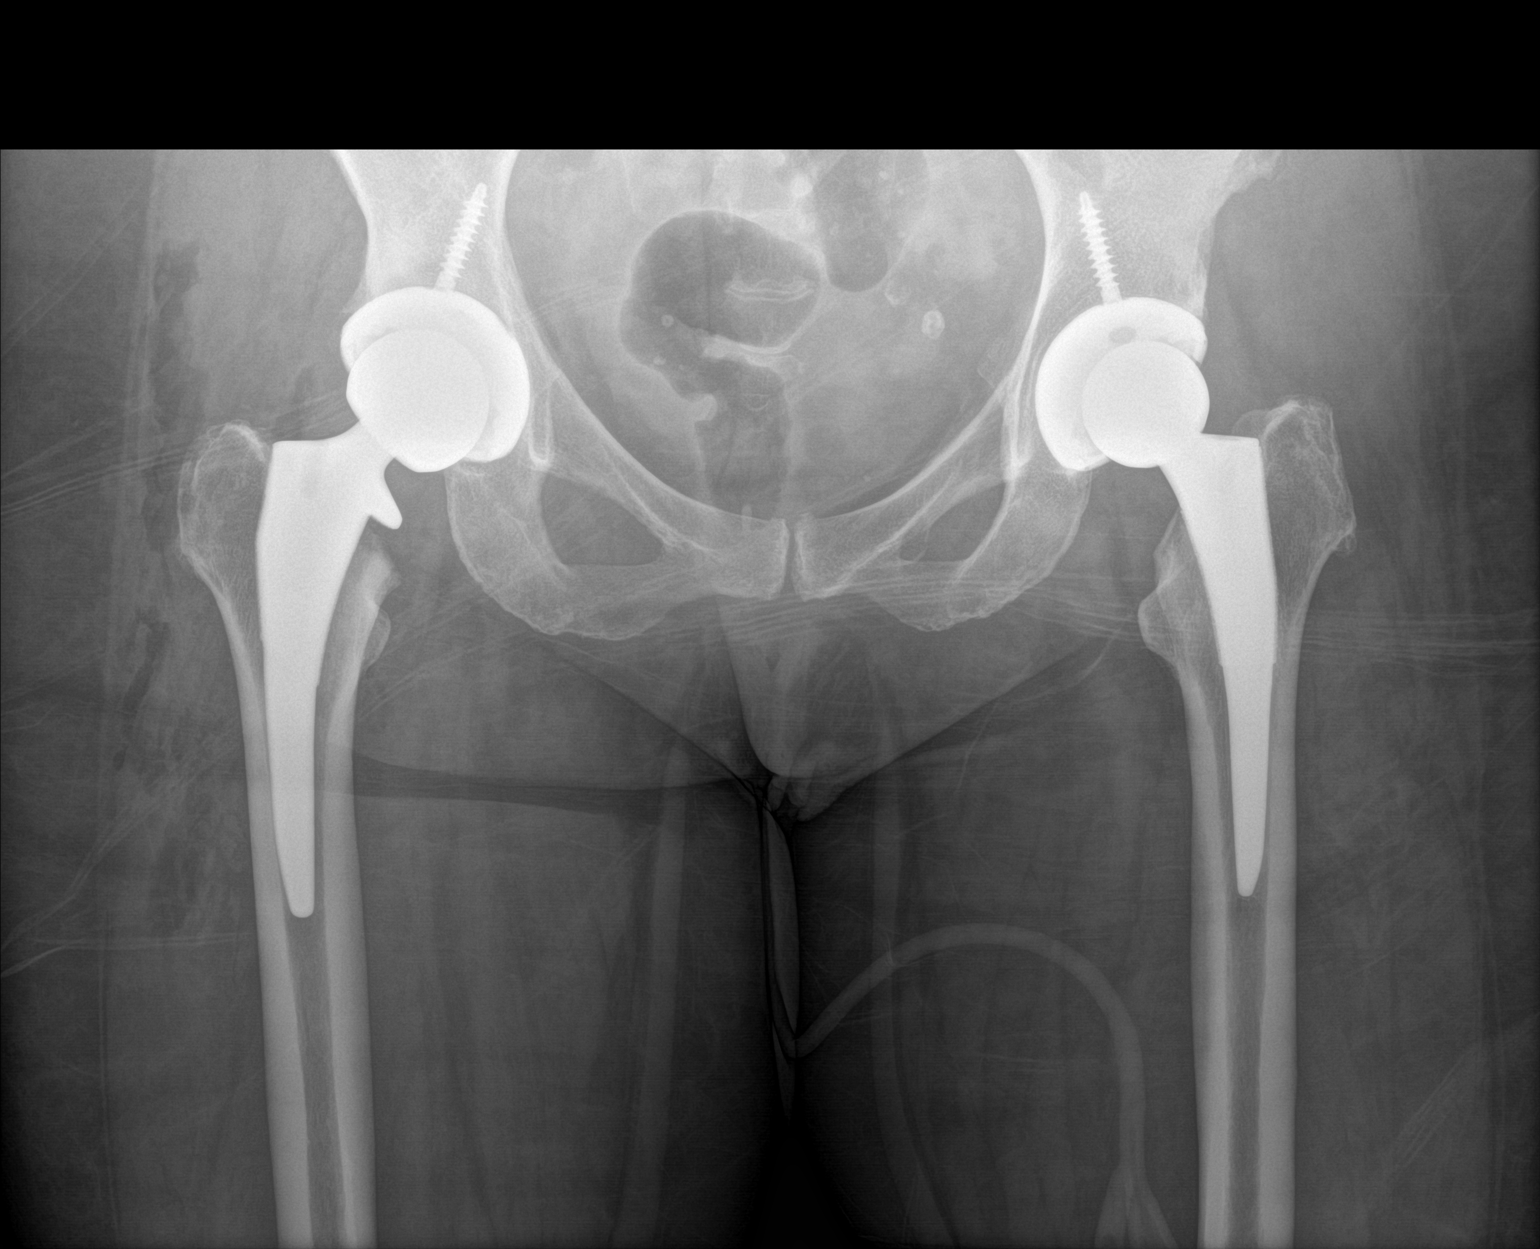

[1 of 1 positions shown; findings below may reference images not displayed]

FINDINGS: Right hip replacement in satisfactory position and alignment. No
fracture

Chronic left hip replacement also in satisfactory position alignment
IMPRESSION: Right hip replacement without complication.

## 2021-12-14 IMAGING — RF DG HIP (WITH PELVIS) OPERATIVE*R*
1 series · 7 of 7 positions shown · non-contrast
Comparison: None.

CLINICAL DATA: RIGHT hip arthroplasty

EXAM:
OPERATIVE RIGHT HIP (WITH PELVIS IF PERFORMED) 2 VIEWS
TECHNIQUE: Fluoroscopic spot image(s) were submitted for interpretation
post-operatively.

[Series 1: unknown protocol · 0.20mm/px · 7 of 7 slices shown]
[im 1/7]
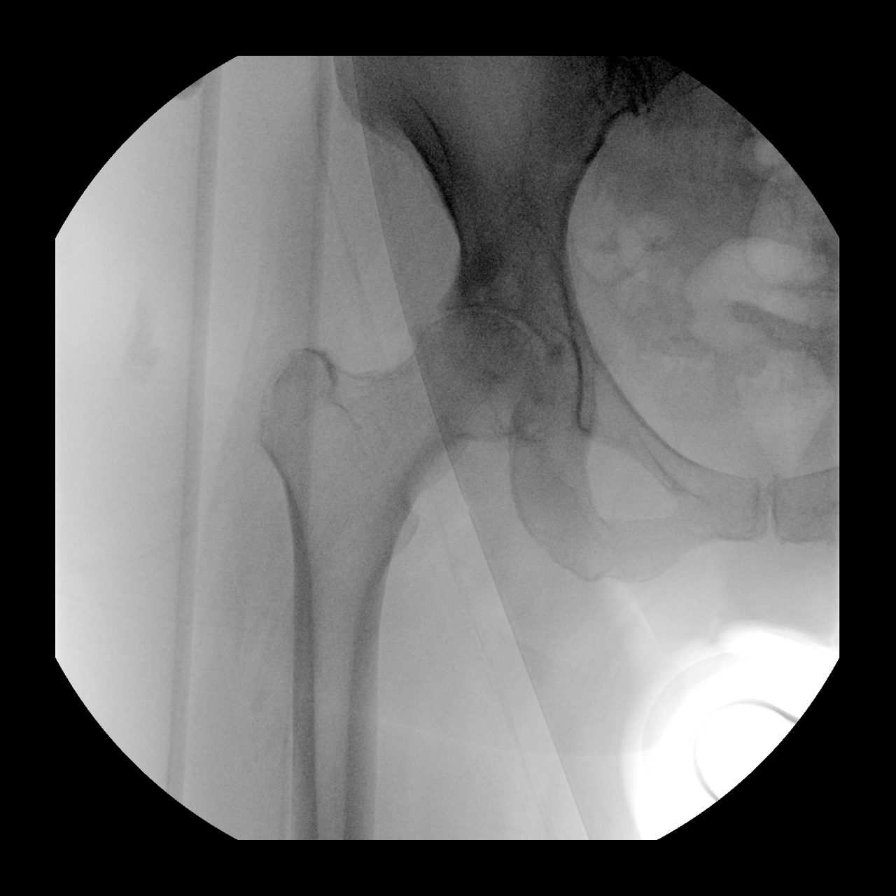
[im 2/7]
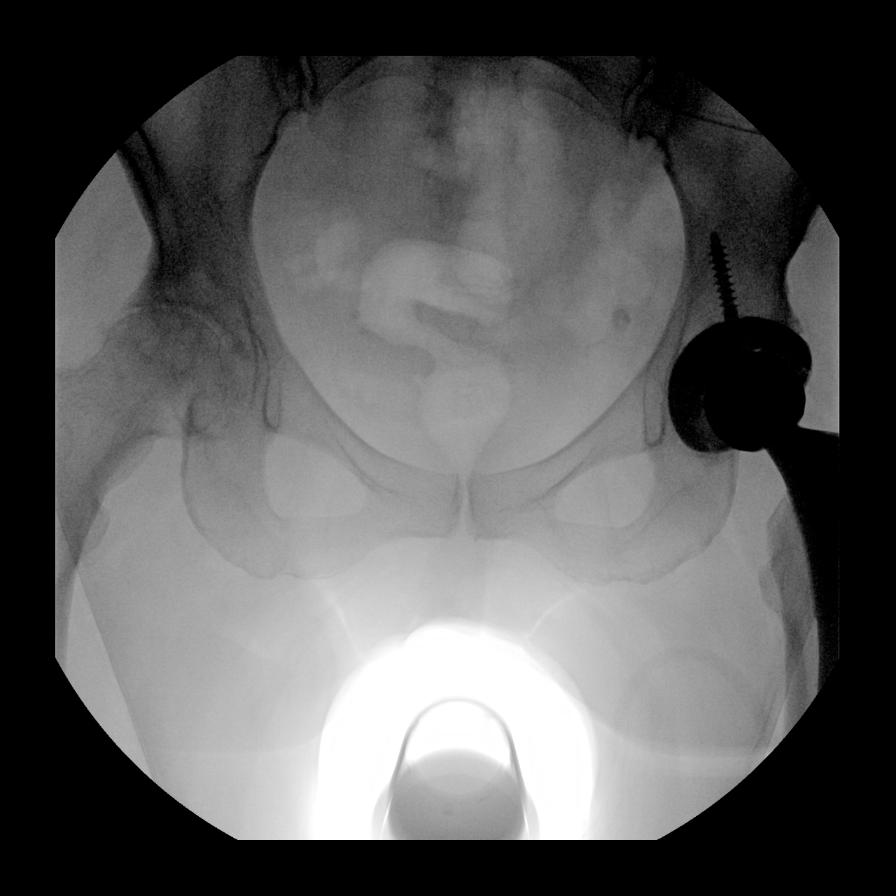
[im 3/7]
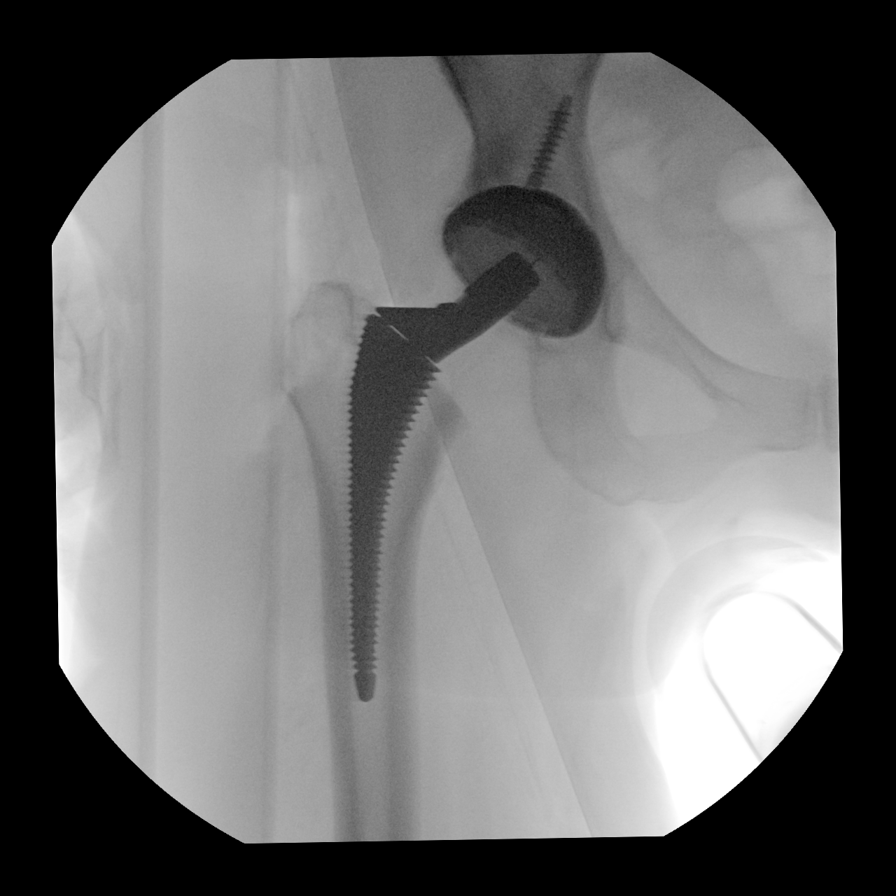
[im 4/7]
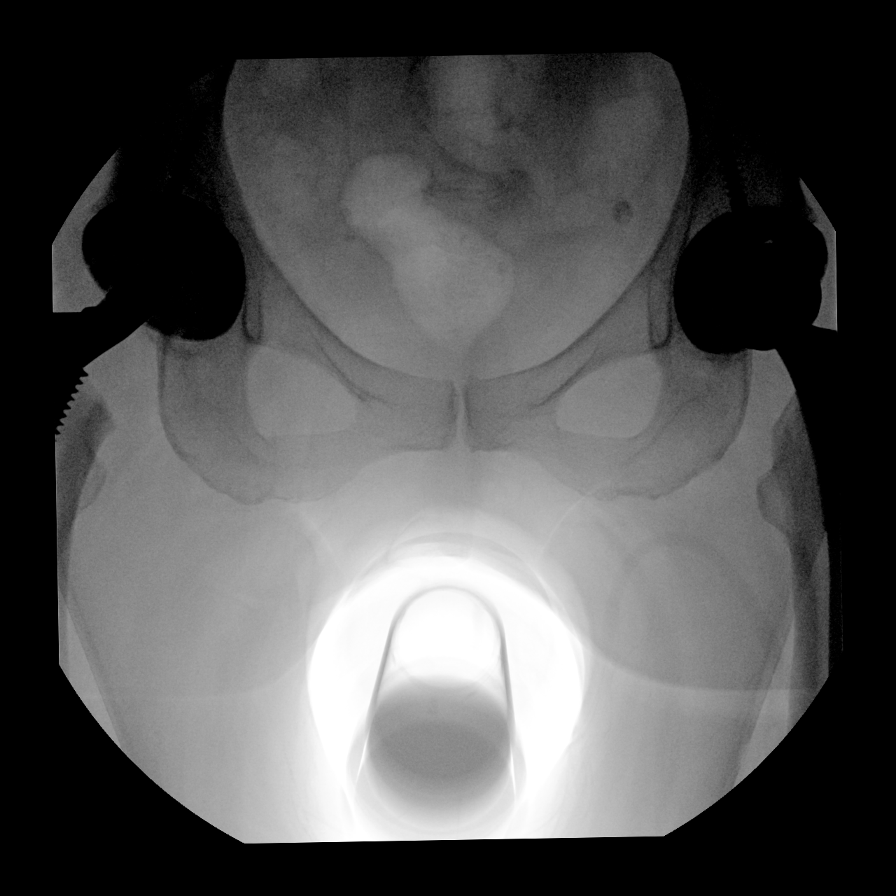
[im 5/7]
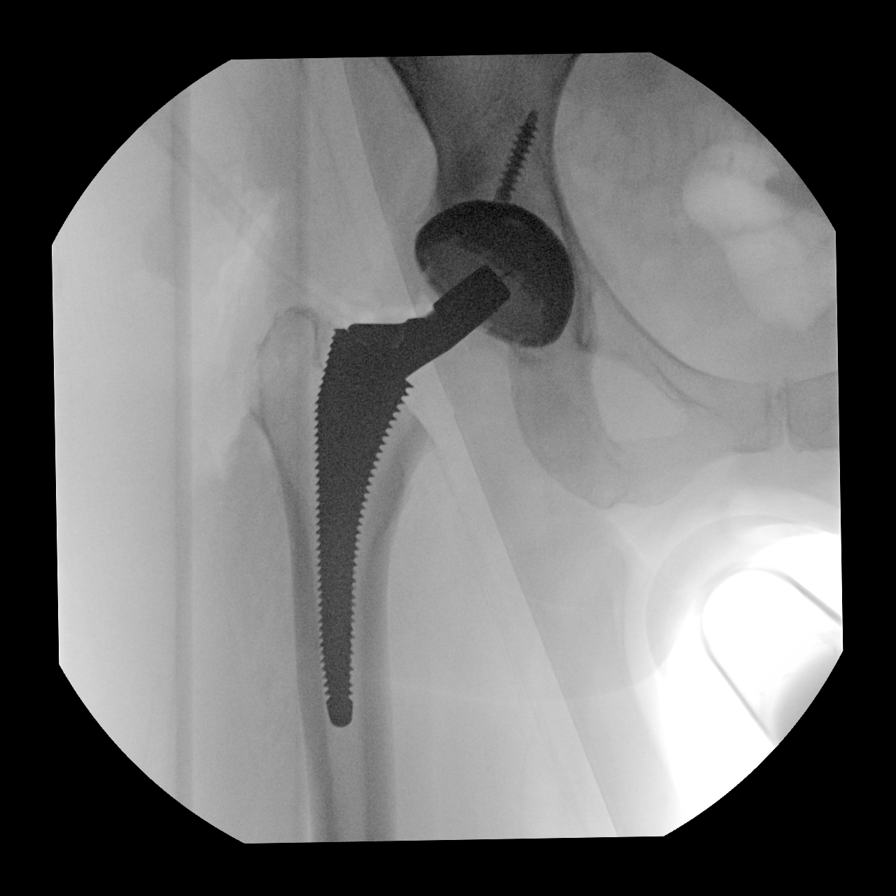
[im 6/7]
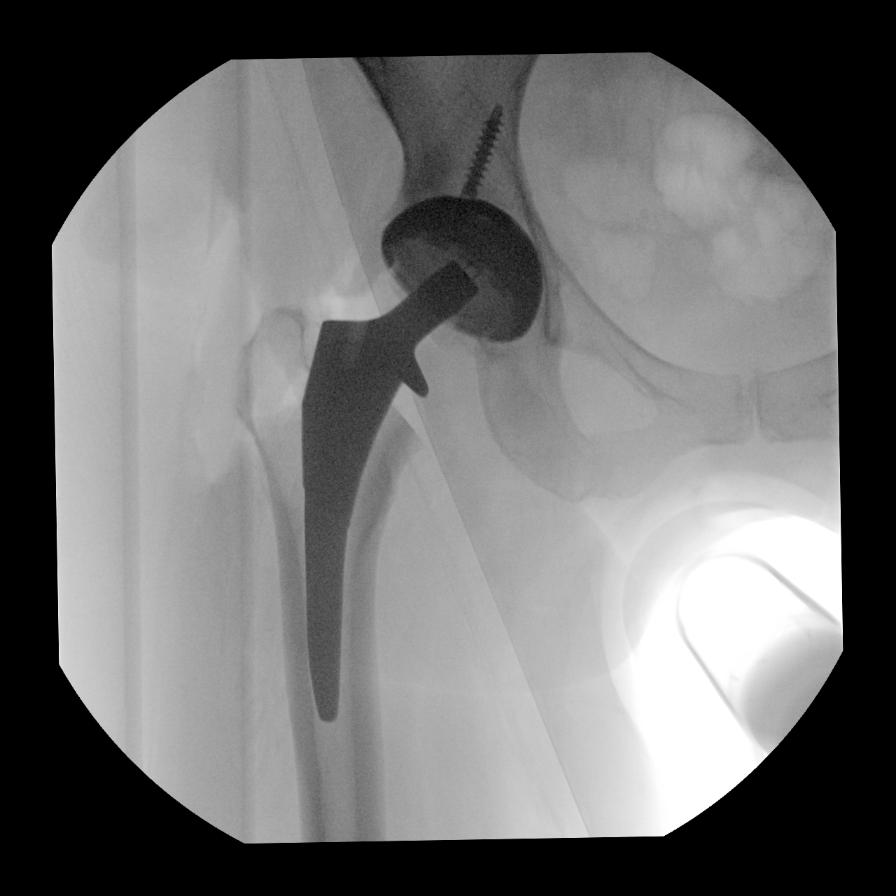
[im 7/7]
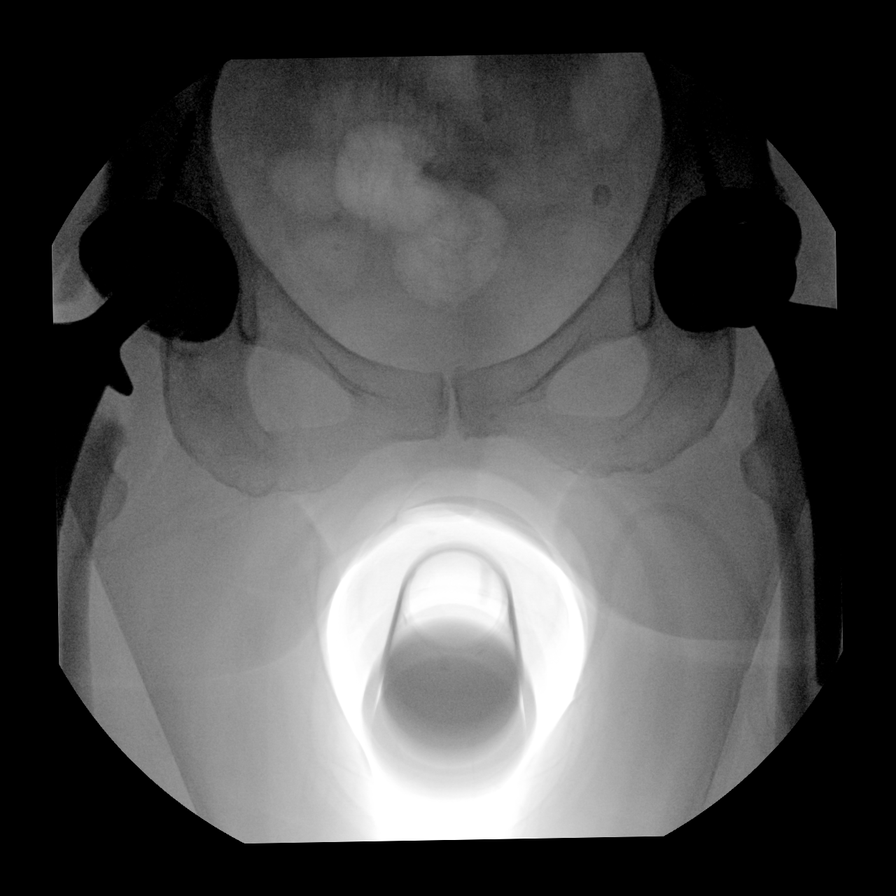

[7 of 7 positions shown; findings below may reference images not displayed]

FINDINGS: Interval RIGHT hip total arthroplasty. No complicating features.
LEFT hip arthroplasty noted.
IMPRESSION: RIGHT hip total arthroplasty

## 2021-12-15 ENCOUNTER — Encounter: Payer: Self-pay | Admitting: Family Medicine

## 2021-12-16 ENCOUNTER — Encounter: Payer: Self-pay | Admitting: Family Medicine

## 2021-12-16 NOTE — Telephone Encounter (Signed)
Message handled through yesterday's patient message

## 2021-12-17 ENCOUNTER — Other Ambulatory Visit (HOSPITAL_COMMUNITY): Payer: Self-pay

## 2021-12-17 ENCOUNTER — Telehealth (INDEPENDENT_AMBULATORY_CARE_PROVIDER_SITE_OTHER): Payer: No Typology Code available for payment source | Admitting: Family Medicine

## 2021-12-17 ENCOUNTER — Encounter: Payer: Self-pay | Admitting: Family Medicine

## 2021-12-17 DIAGNOSIS — U071 COVID-19: Secondary | ICD-10-CM | POA: Diagnosis not present

## 2021-12-17 MED ORDER — MOLNUPIRAVIR EUA 200MG CAPSULE
4.0000 | ORAL_CAPSULE | Freq: Two times a day (BID) | ORAL | 0 refills | Status: AC
  Filled 2021-12-17: qty 40, 5d supply, fill #0

## 2021-12-17 MED ORDER — GUAIFENESIN-CODEINE 100-10 MG/5ML PO SYRP
10.0000 mL | ORAL_SOLUTION | Freq: Three times a day (TID) | ORAL | 0 refills | Status: DC | PRN
  Filled 2021-12-17: qty 120, 4d supply, fill #0

## 2021-12-17 NOTE — Progress Notes (Signed)
Virtual Visit via Video   I connected with patient on 12/17/21 at 11:30 AM EST by a video enabled telemedicine application and verified that I am speaking with the correct person using two identifiers.  Location patient: Home Location provider: [facility], Office Persons participating in the virtual visit: Patient, Provider, [place] Cla[employer] Billings C)  I discussed the limitations of evaluation and management by telemedicine and the availability of in person appointments. The patient expressed understanding and agreed to proceed.  Subjective:   HPI:   COVID- got home from work on Saturday and husband had fever to 102.  Tested + for COVID in ER.  Pt developed a scratchy throat on Monday and sxs worsened from there.  + fever, runny nose, 'really bad headache'.  + body aches, nausea, 'really bad sore throat'.  Tested + Tuesday afternoon.    ROS:   See pertinent positives and negatives per HPI.  Patient Active Problem List   Diagnosis Date Noted   Osteoarthritis of right hip 07/08/2020   S/P left THA, AA 06/20/2018   Heart palpitations 05/09/2018   Excessive daytime sleepiness 05/09/2018   Obesity (BMI 30-39.9) 05/09/2018   Pain of left hip joint 05/01/2018   Physical exam 03/21/2018   Vitamin D deficiency 03/21/2018   Atrophic vaginitis 02/26/2016   Hyperlipidemia 12/27/2014   Pre-diabetes 12/25/2014   Anxiety and depression 12/25/2014   Anxiety 12/25/2014    Social History   Tobacco Use   Smoking status: Never   Smokeless tobacco: Never  Substance Use Topics   Alcohol use: Yes    Comment: occasional    Current Outpatient Medications:    ALPRAZolam (XANAX) 0.5 MG tablet, Take 1 tablet (0.5 mg total) by mouth 2 (two) times daily as needed for anxiety., Disp: 30 tablet, Rfl: 1   atorvastatin (LIPITOR) 10 MG tablet, TAKE 1 TABLET BY MOUTH ONCE DAILY, Disp: 90 tablet, Rfl: 0   buPROPion (WELLBUTRIN XL) 300 MG 24 hr tablet, Take 1 tablet (300 mg total) by mouth daily., Disp:  30 tablet, Rfl: 3   metFORMIN (GLUCOPHAGE) 500 MG tablet, Take 1 tablet by mouth daily with breakfast., Disp: 90 tablet, Rfl: 1   pantoprazole (PROTONIX) 40 MG tablet, TAKE 1 TABLET BY MOUTH ONCE DAILY, Disp: 90 tablet, Rfl: 1   Red Yeast Rice 600 MG CAPS, Take 600 mg by mouth daily. , Disp: , Rfl:    sertraline (ZOLOFT) 100 MG tablet, TAKE 1 TABLET BY MOUTH ONCE A DAY * NEEDS APPT FOR FURTHER REFILLS, Disp: 90 tablet, Rfl: 1   vitamin E 400 UNIT capsule, Take 400 Units by mouth daily. , Disp: , Rfl:    ferrous sulfate (FERROUSUL) 325 (65 FE) MG tablet, Take 1 tablet (325 mg total) by mouth 3 (three) times daily with meals for 14 days., Disp: 42 tablet, Rfl: 0  No Known Allergies  Objective:   LMP 10/07/2011  AAOx3, NAD NCAT, EOMI No obvious CN deficits Coloring WNL Pt is able to speak clearly, coherently without shortness of breath or increased work of breathing.  Thought process is linear.  Mood is appropriate.   Assessment and Plan:   COVID- new.  Pt's husband tested + over the weekend and she tested + on Tuesday after developing sxs on Monday.  Start Molnupiravir.  Reviewed how to take this and possible side effects.  Reviewed supportive care and red flags that should prompt return.  Pt expressed understanding and is in agreement w/ plan.    Annye Asa, MD  12/17/2021 ° °

## 2022-01-07 ENCOUNTER — Telehealth (INDEPENDENT_AMBULATORY_CARE_PROVIDER_SITE_OTHER): Payer: No Typology Code available for payment source | Admitting: Family Medicine

## 2022-01-07 ENCOUNTER — Encounter: Payer: Self-pay | Admitting: Family Medicine

## 2022-01-07 DIAGNOSIS — F419 Anxiety disorder, unspecified: Secondary | ICD-10-CM

## 2022-01-07 DIAGNOSIS — F32A Depression, unspecified: Secondary | ICD-10-CM | POA: Diagnosis not present

## 2022-01-07 NOTE — Progress Notes (Signed)
Virtual Visit via Video   I connected with patient on 01/07/22 at  9:30 AM EST by a video enabled telemedicine application and verified that I am speaking with the correct person using two identifiers.  Location patient: Home Location provider: [facility], Office Persons participating in the virtual visit: Patient, Provider, [place] [facility] Billings C)  I discussed the limitations of evaluation and management by telemedicine and the availability of in person appointments. The patient expressed understanding and agreed to proceed.  Subjective:   HPI:   Anxiety/depression- at last visit we increased Wellbutrin to 300mg , kept Sertraline at 100mg , and added Alprazolam as needed.  Pt has found that she required only a 1/2 tab on a few occasions but that it was helpful.    ROS:   See pertinent positives and negatives per HPI.  Patient Active Problem List   Diagnosis Date Noted   Osteoarthritis of right hip 07/08/2020   S/P left THA, AA 06/20/2018   Heart palpitations 05/09/2018   Excessive daytime sleepiness 05/09/2018   Obesity (BMI 30-39.9) 05/09/2018   Pain of left hip joint 05/01/2018   Physical exam 03/21/2018   Vitamin D deficiency 03/21/2018   Atrophic vaginitis 02/26/2016   Hyperlipidemia 12/27/2014   Pre-diabetes 12/25/2014   Anxiety and depression 12/25/2014   Anxiety 12/25/2014    Social History   Tobacco Use   Smoking status: Never   Smokeless tobacco: Never  Substance Use Topics   Alcohol use: Yes    Comment: occasional    Current Outpatient Medications:    ALPRAZolam (XANAX) 0.5 MG tablet, Take 1 tablet (0.5 mg total) by mouth 2 (two) times daily as needed for anxiety., Disp: 30 tablet, Rfl: 1   atorvastatin (LIPITOR) 10 MG tablet, TAKE 1 TABLET BY MOUTH ONCE DAILY, Disp: 90 tablet, Rfl: 0   buPROPion (WELLBUTRIN XL) 300 MG 24 hr tablet, Take 1 tablet (300 mg total) by mouth daily., Disp: 30 tablet, Rfl: 3   metFORMIN (GLUCOPHAGE) 500 MG tablet, Take 1 tablet  by mouth daily with breakfast., Disp: 90 tablet, Rfl: 1   pantoprazole (PROTONIX) 40 MG tablet, TAKE 1 TABLET BY MOUTH ONCE DAILY, Disp: 90 tablet, Rfl: 1   Red Yeast Rice 600 MG CAPS, Take 600 mg by mouth daily. , Disp: , Rfl:    sertraline (ZOLOFT) 100 MG tablet, TAKE 1 TABLET BY MOUTH ONCE A DAY * NEEDS APPT FOR FURTHER REFILLS, Disp: 90 tablet, Rfl: 1   vitamin E 400 UNIT capsule, Take 400 Units by mouth daily. , Disp: , Rfl:    ferrous sulfate (FERROUSUL) 325 (65 FE) MG tablet, Take 1 tablet (325 mg total) by mouth 3 (three) times daily with meals for 14 days., Disp: 42 tablet, Rfl: 0   guaiFENesin-codeine (ROBITUSSIN AC) 100-10 MG/5ML syrup, Take 10 mLs by mouth 3 (three) times daily as needed for cough. (Patient not taking: Reported on 01/07/2022), Disp: 120 mL, Rfl: 0  No Known Allergies  Objective:   LMP 10/07/2011  AAOx3, NAD NCAT, EOMI No obvious CN deficits Coloring WNL Pt is able to speak clearly, coherently without shortness of breath or increased work of breathing.  Thought process is linear.  Mood is appropriate.   Assessment and Plan:   Anxiety/depression- improved w/ increased dose of Wellbutrin and addition of Alprazolam as needed.  Pt feels she is better able to deal w/ her husband's dx and appt schedule since changing the medications.  No additional adjustments at this time but we did discuss that  this is an option going forward should things change.   Annye Asa, MD 01/07/2022

## 2022-02-10 ENCOUNTER — Other Ambulatory Visit: Payer: Self-pay | Admitting: Family Medicine

## 2022-02-10 ENCOUNTER — Other Ambulatory Visit (HOSPITAL_COMMUNITY): Payer: Self-pay

## 2022-02-10 DIAGNOSIS — F419 Anxiety disorder, unspecified: Secondary | ICD-10-CM

## 2022-02-10 MED ORDER — SERTRALINE HCL 100 MG PO TABS
ORAL_TABLET | ORAL | 1 refills | Status: DC
  Filled 2022-02-10: qty 90, 90d supply, fill #0
  Filled 2022-05-03: qty 90, 90d supply, fill #1

## 2022-02-19 ENCOUNTER — Other Ambulatory Visit (HOSPITAL_COMMUNITY): Payer: Self-pay

## 2022-02-22 ENCOUNTER — Other Ambulatory Visit (HOSPITAL_COMMUNITY): Payer: Self-pay

## 2022-03-08 ENCOUNTER — Other Ambulatory Visit (HOSPITAL_COMMUNITY): Payer: Self-pay

## 2022-03-26 ENCOUNTER — Other Ambulatory Visit (HOSPITAL_COMMUNITY): Payer: Self-pay

## 2022-03-26 ENCOUNTER — Other Ambulatory Visit: Payer: Self-pay | Admitting: Family Medicine

## 2022-03-26 MED ORDER — ATORVASTATIN CALCIUM 10 MG PO TABS
ORAL_TABLET | Freq: Every day | ORAL | 0 refills | Status: DC
  Filled 2022-03-26: qty 90, 90d supply, fill #0

## 2022-03-26 MED ORDER — PANTOPRAZOLE SODIUM 40 MG PO TBEC
DELAYED_RELEASE_TABLET | Freq: Every day | ORAL | 1 refills | Status: DC
  Filled 2022-03-26: qty 90, 90d supply, fill #0
  Filled 2022-07-03: qty 90, 90d supply, fill #1

## 2022-03-26 MED ORDER — BUPROPION HCL ER (XL) 300 MG PO TB24
300.0000 mg | ORAL_TABLET | Freq: Every day | ORAL | 3 refills | Status: DC
  Filled 2022-03-26: qty 30, 30d supply, fill #0
  Filled 2022-05-03: qty 30, 30d supply, fill #1
  Filled 2022-06-03: qty 60, 60d supply, fill #2
  Filled 2022-06-04: qty 30, 30d supply, fill #2
  Filled 2022-06-18 – 2022-07-03 (×2): qty 30, 30d supply, fill #3

## 2022-05-03 ENCOUNTER — Other Ambulatory Visit (HOSPITAL_COMMUNITY): Payer: Self-pay

## 2022-05-03 ENCOUNTER — Other Ambulatory Visit (HOSPITAL_BASED_OUTPATIENT_CLINIC_OR_DEPARTMENT_OTHER): Payer: Self-pay

## 2022-05-05 ENCOUNTER — Other Ambulatory Visit (HOSPITAL_COMMUNITY): Payer: Self-pay

## 2022-05-27 ENCOUNTER — Encounter: Payer: No Typology Code available for payment source | Admitting: Family Medicine

## 2022-06-03 ENCOUNTER — Other Ambulatory Visit (HOSPITAL_COMMUNITY): Payer: Self-pay

## 2022-06-04 ENCOUNTER — Other Ambulatory Visit (HOSPITAL_COMMUNITY): Payer: Self-pay

## 2022-06-08 ENCOUNTER — Other Ambulatory Visit: Payer: Self-pay | Admitting: Family Medicine

## 2022-06-08 ENCOUNTER — Other Ambulatory Visit (HOSPITAL_COMMUNITY): Payer: Self-pay

## 2022-06-08 MED ORDER — ALPRAZOLAM 0.5 MG PO TABS
0.5000 mg | ORAL_TABLET | Freq: Two times a day (BID) | ORAL | 1 refills | Status: DC | PRN
  Filled 2022-06-08: qty 30, 15d supply, fill #0

## 2022-06-08 MED ORDER — METFORMIN HCL 500 MG PO TABS
500.0000 mg | ORAL_TABLET | Freq: Every day | ORAL | 1 refills | Status: DC
  Filled 2022-06-08: qty 90, 90d supply, fill #0
  Filled 2022-09-11: qty 90, 90d supply, fill #1

## 2022-06-08 NOTE — Telephone Encounter (Signed)
Patient is requesting a refill of the following medications: Requested Prescriptions   Pending Prescriptions Disp Refills   ALPRAZolam (XANAX) 0.5 MG tablet 30 tablet 1    Sig: Take 1 tablet (0.5 mg total) by mouth 2 (two) times daily as needed for anxiety.   metFORMIN (GLUCOPHAGE) 500 MG tablet 90 tablet 1    Sig: Take 1 tablet by mouth daily with breakfast.    Date of patient request: 06/08/22 Last office visit: 12/03/21 Date of last refill: 11/22/21 Last refill amount: 30

## 2022-06-08 NOTE — Progress Notes (Signed)
Prescription refilled at pt's request 

## 2022-06-09 ENCOUNTER — Other Ambulatory Visit (HOSPITAL_COMMUNITY): Payer: Self-pay

## 2022-06-17 ENCOUNTER — Ambulatory Visit (INDEPENDENT_AMBULATORY_CARE_PROVIDER_SITE_OTHER): Payer: No Typology Code available for payment source | Admitting: Family Medicine

## 2022-06-17 ENCOUNTER — Other Ambulatory Visit (HOSPITAL_COMMUNITY): Payer: Self-pay

## 2022-06-17 ENCOUNTER — Encounter: Payer: Self-pay | Admitting: Family Medicine

## 2022-06-17 VITALS — BP 120/86 | HR 63 | Temp 98.4°F | Resp 20 | Ht 65.0 in | Wt 216.4 lb

## 2022-06-17 DIAGNOSIS — Z0000 Encounter for general adult medical examination without abnormal findings: Secondary | ICD-10-CM | POA: Diagnosis not present

## 2022-06-17 DIAGNOSIS — E669 Obesity, unspecified: Secondary | ICD-10-CM

## 2022-06-17 DIAGNOSIS — Z23 Encounter for immunization: Secondary | ICD-10-CM | POA: Diagnosis not present

## 2022-06-17 DIAGNOSIS — E559 Vitamin D deficiency, unspecified: Secondary | ICD-10-CM

## 2022-06-17 LAB — BASIC METABOLIC PANEL
BUN: 19 mg/dL (ref 6–23)
CO2: 31 mEq/L (ref 19–32)
Calcium: 9.6 mg/dL (ref 8.4–10.5)
Chloride: 103 mEq/L (ref 96–112)
Creatinine, Ser: 0.83 mg/dL (ref 0.40–1.20)
GFR: 76.63 mL/min (ref >60.00)
Glucose, Bld: 94 mg/dL (ref 70–99)
Potassium: 4.5 mEq/L (ref 3.5–5.1)
Sodium: 140 mEq/L (ref 135–145)

## 2022-06-17 LAB — LIPID PANEL
Cholesterol: 208 mg/dL — ABNORMAL HIGH (ref 0–200)
HDL: 77.3 mg/dL (ref >39.00)
LDL Cholesterol: 114 mg/dL — ABNORMAL HIGH (ref 0–99)
NonHDL: 131
Total CHOL/HDL Ratio: 3
Triglycerides: 85 mg/dL (ref 0.0–149.0)
VLDL: 17 mg/dL (ref 0.0–40.0)

## 2022-06-17 LAB — CBC WITH DIFFERENTIAL/PLATELET
Basophils Absolute: 0 10*3/uL (ref 0.0–0.1)
Basophils Relative: 0.9 % (ref 0.0–3.0)
Eosinophils Absolute: 0.1 10*3/uL (ref 0.0–0.7)
Eosinophils Relative: 2.5 % (ref 0.0–5.0)
HCT: 43.4 % (ref 36.0–46.0)
Hemoglobin: 14.2 g/dL (ref 12.0–15.0)
Lymphocytes Relative: 29 % (ref 12.0–46.0)
Lymphs Abs: 1.4 10*3/uL (ref 0.7–4.0)
MCHC: 32.6 g/dL (ref 30.0–36.0)
MCV: 94.8 fl (ref 78.0–100.0)
Monocytes Absolute: 0.4 10*3/uL (ref 0.1–1.0)
Monocytes Relative: 8.2 % (ref 3.0–12.0)
Neutro Abs: 3 10*3/uL (ref 1.4–7.7)
Neutrophils Relative %: 59.4 % (ref 43.0–77.0)
Platelets: 205 10*3/uL (ref 150.0–400.0)
RBC: 4.57 Mil/uL (ref 3.87–5.11)
RDW: 13.5 % (ref 11.5–15.5)
WBC: 5 10*3/uL (ref 4.0–10.5)

## 2022-06-17 LAB — HEPATIC FUNCTION PANEL
ALT: 28 U/L (ref 0–35)
AST: 18 U/L (ref 0–37)
Albumin: 4.6 g/dL (ref 3.5–5.2)
Alkaline Phosphatase: 66 U/L (ref 39–117)
Bilirubin, Direct: 0 mg/dL (ref 0.0–0.3)
Total Bilirubin: 0.4 mg/dL (ref 0.2–1.2)
Total Protein: 7.4 g/dL (ref 6.0–8.3)

## 2022-06-17 LAB — VITAMIN D 25 HYDROXY (VIT D DEFICIENCY, FRACTURES): VITD: 22.55 ng/mL — ABNORMAL LOW (ref 30.00–100.00)

## 2022-06-17 LAB — TSH: TSH: 1.58 u[IU]/mL (ref 0.35–5.50)

## 2022-06-17 MED ORDER — MELOXICAM 15 MG PO TABS
15.0000 mg | ORAL_TABLET | Freq: Every day | ORAL | 1 refills | Status: DC
  Filled 2022-06-17: qty 90, 90d supply, fill #0
  Filled 2022-09-11: qty 90, 90d supply, fill #1

## 2022-06-17 NOTE — Assessment & Plan Note (Signed)
Check labs and replete prn. 

## 2022-06-17 NOTE — Patient Instructions (Addendum)
Follow up in 6 months to recheck cholesterol We'll notify you of your lab results and make any changes if needed START the Meloxicam once daily- take w/ food Keep up the good work on healthy diet and regular exercise- you look great!! Call with any questions or concerns Stay Safe!  Stay Healthy! Hang in there!!!

## 2022-06-17 NOTE — Progress Notes (Signed)
   Subjective:    Patient ID: Tracy Pitts, female    DOB: 24-Nov-1960, 61 y.o.   MRN: [id number]  HPI CPE- UTD on colonoscopy, mammogram, pap, Tdap.  Patient Care Team    Relationship Specialty Notifications Start End  Midge Minium, MD PCP - General Family Medicine  05/20/17   Sueanne Margarita, MD PCP - Cardiology Cardiology Admissions 06/08/18   Paula Compton, MD Consulting Physician Obstetrics and Gynecology  05/20/17   Irene Shipper, MD Consulting Physician Gastroenterology  05/20/17      Health Maintenance  Topic Date Due   Zoster Vaccines- Shingrix (1 of 2) Never done   COVID-19 Vaccine (3 - Pfizer series) 07/03/2022 (Originally 08/05/2020)   MAMMOGRAM  06/18/2023 (Originally 06/10/2022)   INFLUENZA VACCINE  06/22/2022   COLONOSCOPY (Pts 45-67yr Insurance coverage will need to be confirmed)  02/22/2023   PAP SMEAR-Modifier  09/03/2024   TETANUS/TDAP  02/18/2027   Hepatitis C Screening  Completed   HIV Screening  Completed   HPV VACCINES  Aged Out      Review of Systems Patient reports no vision/ hearing changes, adenopathy,fever, weight change,  persistant/recurrent hoarseness , swallowing issues, chest pain, palpitations, edema, persistant/recurrent cough, hemoptysis, dyspnea (rest/exertional/paroxysmal nocturnal), gastrointestinal bleeding (melena, rectal bleeding), abdominal pain, significant heartburn, bowel changes, GU symptoms (dysuria, hematuria, incontinence), Gyn symptoms (abnormal  bleeding, pain),  syncope, focal weakness, memory loss, numbness & tingling, skin/hair/nail changes, abnormal bruising or bleeding, anxiety, or depression.     Objective:   Physical Exam General Appearance:    Alert, cooperative, no distress, appears stated age, obese  Head:    Normocephalic, without obvious abnormality, atraumatic  Eyes:    PERRL, conjunctiva/corneas clear, EOM's intact both eyes  Ears:    Normal TM's and external ear canals, both ears  Nose:   Nares normal,  septum midline, mucosa normal, no drainage    or sinus tenderness  Throat:   Lips, mucosa, and tongue normal; teeth and gums normal  Neck:   Supple, symmetrical, trachea midline, no adenopathy;    Thyroid: no enlargement/tenderness/nodules  Back:     Symmetric, no curvature, ROM normal, no CVA tenderness  Lungs:     Clear to auscultation bilaterally, respirations unlabored  Chest Wall:    No tenderness or deformity   Heart:    Regular rate and rhythm, S1 and S2 normal, no murmur, rub   or gallop  Breast Exam:    Deferred to GYN  Abdomen:     Soft, non-tender, bowel sounds active all four quadrants,    no masses, no organomegaly  Genitalia:    Deferred to GYN  Rectal:    Extremities:   Extremities normal, atraumatic, no cyanosis or edema  Pulses:   2+ and symmetric all extremities  Skin:   Skin color, texture, turgor normal, no rashes or lesions  Lymph nodes:   Cervical, supraclavicular, and axillary nodes normal  Neurologic:   CNII-XII intact, normal strength, sensation and reflexes    throughout          Assessment & Plan:

## 2022-06-17 NOTE — Assessment & Plan Note (Signed)
Pt's PE WNL w/ exception of obesity.  UTD on colonoscopy, mammo, pap, Tdap.  Check labs.  Anticipatory guidance provided.

## 2022-06-17 NOTE — Assessment & Plan Note (Signed)
Ongoing issue for pt.  Encouraged healthy diet and regular exercise.  Check labs to risk stratify.  Will follow.

## 2022-06-18 ENCOUNTER — Other Ambulatory Visit: Payer: Self-pay | Admitting: Family Medicine

## 2022-06-18 ENCOUNTER — Other Ambulatory Visit (HOSPITAL_COMMUNITY): Payer: Self-pay

## 2022-06-18 ENCOUNTER — Other Ambulatory Visit: Payer: Self-pay

## 2022-06-18 MED ORDER — VITAMIN D (ERGOCALCIFEROL) 1.25 MG (50000 UNIT) PO CAPS
50000.0000 [IU] | ORAL_CAPSULE | ORAL | 7 refills | Status: AC
  Filled 2022-06-18: qty 12, 84d supply, fill #0
  Filled 2022-09-11: qty 12, 84d supply, fill #1
  Filled 2022-12-01: qty 12, 84d supply, fill #2
  Filled 2023-01-08 – 2023-02-24 (×2): qty 12, 84d supply, fill #3
  Filled 2023-05-26: qty 12, 84d supply, fill #4

## 2022-06-18 MED ORDER — ATORVASTATIN CALCIUM 10 MG PO TABS
ORAL_TABLET | Freq: Every day | ORAL | 0 refills | Status: DC
  Filled 2022-06-18: qty 90, 90d supply, fill #0

## 2022-06-18 NOTE — Progress Notes (Signed)
Pt seen results via my chart Vitam D sent to pharmacy

## 2022-06-21 ENCOUNTER — Encounter: Payer: Self-pay | Admitting: Family Medicine

## 2022-06-23 ENCOUNTER — Other Ambulatory Visit (HOSPITAL_COMMUNITY): Payer: Self-pay

## 2022-07-03 ENCOUNTER — Other Ambulatory Visit (HOSPITAL_COMMUNITY): Payer: Self-pay

## 2022-07-30 ENCOUNTER — Other Ambulatory Visit: Payer: Self-pay | Admitting: Family Medicine

## 2022-07-30 DIAGNOSIS — F419 Anxiety disorder, unspecified: Secondary | ICD-10-CM

## 2022-08-02 ENCOUNTER — Other Ambulatory Visit (HOSPITAL_COMMUNITY): Payer: Self-pay

## 2022-08-02 MED ORDER — BUPROPION HCL ER (XL) 300 MG PO TB24
300.0000 mg | ORAL_TABLET | Freq: Every day | ORAL | 3 refills | Status: DC
  Filled 2022-08-02: qty 30, 30d supply, fill #0

## 2022-08-02 MED ORDER — SERTRALINE HCL 100 MG PO TABS
100.0000 mg | ORAL_TABLET | Freq: Every day | ORAL | 1 refills | Status: DC
  Filled 2022-08-02: qty 90, 90d supply, fill #0
  Filled 2022-10-26 – 2022-12-01 (×2): qty 90, 90d supply, fill #1

## 2022-08-05 ENCOUNTER — Other Ambulatory Visit (HOSPITAL_COMMUNITY): Payer: Self-pay

## 2022-08-05 ENCOUNTER — Encounter: Payer: Self-pay | Admitting: Family Medicine

## 2022-08-05 MED ORDER — BUPROPION HCL ER (XL) 300 MG PO TB24
300.0000 mg | ORAL_TABLET | Freq: Every day | ORAL | 3 refills | Status: DC
  Filled 2022-08-05 – 2022-08-31 (×2): qty 90, 90d supply, fill #0
  Filled 2022-12-01: qty 90, 90d supply, fill #1
  Filled 2023-02-24: qty 90, 90d supply, fill #2
  Filled 2023-05-26: qty 90, 90d supply, fill #3

## 2022-08-06 ENCOUNTER — Other Ambulatory Visit (HOSPITAL_COMMUNITY): Payer: Self-pay

## 2022-08-31 ENCOUNTER — Other Ambulatory Visit (HOSPITAL_COMMUNITY): Payer: Self-pay

## 2022-09-11 ENCOUNTER — Other Ambulatory Visit (HOSPITAL_COMMUNITY): Payer: Self-pay

## 2022-09-15 ENCOUNTER — Other Ambulatory Visit: Payer: Self-pay

## 2022-09-15 ENCOUNTER — Other Ambulatory Visit (HOSPITAL_COMMUNITY): Payer: Self-pay

## 2022-09-16 ENCOUNTER — Other Ambulatory Visit: Payer: Self-pay | Admitting: Family Medicine

## 2022-09-16 ENCOUNTER — Other Ambulatory Visit (HOSPITAL_COMMUNITY): Payer: Self-pay

## 2022-09-16 MED ORDER — ATORVASTATIN CALCIUM 10 MG PO TABS
10.0000 mg | ORAL_TABLET | Freq: Every day | ORAL | 0 refills | Status: DC
  Filled 2022-09-16: qty 90, 90d supply, fill #0

## 2022-09-18 ENCOUNTER — Other Ambulatory Visit (HOSPITAL_COMMUNITY): Payer: Self-pay

## 2022-09-23 ENCOUNTER — Telehealth: Payer: Self-pay | Admitting: Lab

## 2022-09-23 ENCOUNTER — Telehealth: Payer: Self-pay | Admitting: Family Medicine

## 2022-09-23 NOTE — Telephone Encounter (Signed)
Received paperwork for short term disability from the front and put it in Dr. Rande Lawman folder to be filled out

## 2022-09-23 NOTE — Telephone Encounter (Signed)
Received short term disability forms from [employer]. Placed in front bin with charge sheet

## 2022-09-27 NOTE — Telephone Encounter (Signed)
Spoke to the pt and she states she is needing the FMLA papers for her father and husband . The form was suppose to go to the [facility] for her husband and one goes to DR Quay Burow for her father . We do not need to fill these out per pt

## 2022-09-29 ENCOUNTER — Other Ambulatory Visit (HOSPITAL_COMMUNITY): Payer: Self-pay

## 2022-10-07 ENCOUNTER — Other Ambulatory Visit: Payer: Self-pay | Admitting: Family Medicine

## 2022-10-07 ENCOUNTER — Other Ambulatory Visit (HOSPITAL_COMMUNITY): Payer: Self-pay

## 2022-10-07 MED ORDER — PANTOPRAZOLE SODIUM 40 MG PO TBEC
40.0000 mg | DELAYED_RELEASE_TABLET | Freq: Every day | ORAL | 1 refills | Status: DC
  Filled 2022-10-07: qty 90, 90d supply, fill #0
  Filled 2022-12-27: qty 90, 90d supply, fill #1

## 2022-10-26 ENCOUNTER — Other Ambulatory Visit (HOSPITAL_COMMUNITY): Payer: Self-pay

## 2022-11-23 ENCOUNTER — Other Ambulatory Visit (HOSPITAL_COMMUNITY): Payer: Self-pay

## 2022-12-01 ENCOUNTER — Other Ambulatory Visit: Payer: Self-pay

## 2022-12-10 ENCOUNTER — Other Ambulatory Visit: Payer: Self-pay | Admitting: Family Medicine

## 2022-12-10 ENCOUNTER — Other Ambulatory Visit (HOSPITAL_COMMUNITY): Payer: Self-pay

## 2022-12-10 MED ORDER — ATORVASTATIN CALCIUM 10 MG PO TABS
10.0000 mg | ORAL_TABLET | Freq: Every day | ORAL | 0 refills | Status: DC
  Filled 2022-12-10: qty 90, 90d supply, fill #0

## 2022-12-10 MED ORDER — METFORMIN HCL 500 MG PO TABS
500.0000 mg | ORAL_TABLET | Freq: Every day | ORAL | 1 refills | Status: DC
  Filled 2022-12-10: qty 90, 90d supply, fill #0
  Filled 2023-02-24: qty 90, 90d supply, fill #1

## 2022-12-17 ENCOUNTER — Ambulatory Visit: Payer: 59 | Admitting: Family Medicine

## 2022-12-20 ENCOUNTER — Ambulatory Visit: Payer: No Typology Code available for payment source | Admitting: Family Medicine

## 2022-12-21 ENCOUNTER — Other Ambulatory Visit (HOSPITAL_COMMUNITY): Payer: Self-pay

## 2022-12-21 ENCOUNTER — Other Ambulatory Visit: Payer: Self-pay | Admitting: Family Medicine

## 2022-12-21 MED ORDER — MELOXICAM 15 MG PO TABS
15.0000 mg | ORAL_TABLET | Freq: Every day | ORAL | 1 refills | Status: DC
  Filled 2022-12-21: qty 90, 90d supply, fill #0
  Filled 2023-02-24 – 2023-03-16 (×2): qty 90, 90d supply, fill #1

## 2022-12-27 ENCOUNTER — Other Ambulatory Visit (HOSPITAL_COMMUNITY): Payer: Self-pay

## 2023-01-06 ENCOUNTER — Ambulatory Visit (INDEPENDENT_AMBULATORY_CARE_PROVIDER_SITE_OTHER): Payer: 59 | Admitting: Family Medicine

## 2023-01-06 ENCOUNTER — Encounter: Payer: Self-pay | Admitting: Family Medicine

## 2023-01-06 VITALS — BP 138/86 | HR 68 | Temp 98.9°F | Resp 17 | Ht 65.0 in | Wt 221.2 lb

## 2023-01-06 DIAGNOSIS — F419 Anxiety disorder, unspecified: Secondary | ICD-10-CM

## 2023-01-06 DIAGNOSIS — E669 Obesity, unspecified: Secondary | ICD-10-CM

## 2023-01-06 DIAGNOSIS — F32A Depression, unspecified: Secondary | ICD-10-CM

## 2023-01-06 DIAGNOSIS — E785 Hyperlipidemia, unspecified: Secondary | ICD-10-CM

## 2023-01-06 LAB — CBC WITH DIFFERENTIAL/PLATELET
Basophils Absolute: 0 10*3/uL (ref 0.0–0.1)
Basophils Relative: 1 % (ref 0.0–3.0)
Eosinophils Absolute: 0.1 10*3/uL (ref 0.0–0.7)
Eosinophils Relative: 3.2 % (ref 0.0–5.0)
HCT: 45.7 % (ref 36.0–46.0)
Hemoglobin: 14.9 g/dL (ref 12.0–15.0)
Lymphocytes Relative: 29.2 % (ref 12.0–46.0)
Lymphs Abs: 1.3 10*3/uL (ref 0.7–4.0)
MCHC: 32.7 g/dL (ref 30.0–36.0)
MCV: 94.2 fl (ref 78.0–100.0)
Monocytes Absolute: 0.4 10*3/uL (ref 0.1–1.0)
Monocytes Relative: 8.6 % (ref 3.0–12.0)
Neutro Abs: 2.7 10*3/uL (ref 1.4–7.7)
Neutrophils Relative %: 58 % (ref 43.0–77.0)
Platelets: 223 10*3/uL (ref 150.0–400.0)
RBC: 4.85 Mil/uL (ref 3.87–5.11)
RDW: 13.4 % (ref 11.5–15.5)
WBC: 4.6 10*3/uL (ref 4.0–10.5)

## 2023-01-06 LAB — LIPID PANEL
Cholesterol: 210 mg/dL — ABNORMAL HIGH (ref 0–200)
HDL: 78.2 mg/dL (ref >39.00)
LDL Cholesterol: 116 mg/dL — ABNORMAL HIGH (ref 0–99)
NonHDL: 131.36
Total CHOL/HDL Ratio: 3
Triglycerides: 76 mg/dL (ref 0.0–149.0)
VLDL: 15.2 mg/dL (ref 0.0–40.0)

## 2023-01-06 LAB — TSH: TSH: 2.57 u[IU]/mL (ref 0.35–5.50)

## 2023-01-06 LAB — HEPATIC FUNCTION PANEL
ALT: 15 U/L (ref 0–35)
AST: 14 U/L (ref 0–37)
Albumin: 4.4 g/dL (ref 3.5–5.2)
Alkaline Phosphatase: 62 U/L (ref 39–117)
Bilirubin, Direct: 0.1 mg/dL (ref 0.0–0.3)
Total Bilirubin: 0.4 mg/dL (ref 0.2–1.2)
Total Protein: 7.1 g/dL (ref 6.0–8.3)

## 2023-01-06 LAB — BASIC METABOLIC PANEL
BUN: 18 mg/dL (ref 6–23)
CO2: 29 mEq/L (ref 19–32)
Calcium: 9.8 mg/dL (ref 8.4–10.5)
Chloride: 103 mEq/L (ref 96–112)
Creatinine, Ser: 0.84 mg/dL (ref 0.40–1.20)
GFR: 75.24 mL/min (ref >60.00)
Glucose, Bld: 81 mg/dL (ref 70–99)
Potassium: 4.3 mEq/L (ref 3.5–5.1)
Sodium: 140 mEq/L (ref 135–145)

## 2023-01-06 NOTE — Assessment & Plan Note (Signed)
Chronic problem.  Currently on Red Yeast Rice and Lipitor 84m daily w/o difficulty.  Check labs.  Adjust meds prn

## 2023-01-06 NOTE — Assessment & Plan Note (Signed)
Chronic problem.  Currently doing well on Wellbutrin XL 370m daily and Sertraline 1023mdaily.  Using alprazolam prn.  Under a lot of stress as dad is nearly end of life and husband has lung cancer.  Given the circumstances, she thinks she is doing ok.  Encouraged her to take time for herself as she can't support everyone else if not caring for herself.  Pt expressed understanding and is in agreement w/ plan.

## 2023-01-06 NOTE — Patient Instructions (Signed)
Schedule your complete physical in 6 months We'll notify you of your lab results and make any changes if needed Make sure you are taking care of yourself while trying to take care of everyone else!!! Call with any questions or concerns Stay Safe!  Stay Healthy! Hang in there!!!

## 2023-01-06 NOTE — Progress Notes (Signed)
   Subjective:    Patient ID: Tracy Pitts, female    DOB: [birth year], 62 y.o.   MRN: [id number]  HPI Hyperlipidemia- chronic problem, on Red Yeast Rice '600mg'$  daily and Lipitor '10mg'$  daily.  No CP, SOB, abd pain, N/V.  Obesity- ongoing issue.  Pt has gained 5 lbs since last visit.  BMI now 36.82  Pt will walk as she is able but life is very hectic right now.    Anxiety- chronic problem, on Wellbutrin XL '300mg'$  daily, Sertraline '100mg'$  daily, and Alprazolam prn.  Dad is in 'end stage heart failure'.  Husband has lung cancer.  Pt feels that sxs are adequately controlled given all that is going on.   Review of Systems For ROS see HPI     Objective:   Physical Exam Vitals reviewed.  Constitutional:      General: She is not in acute distress.    Appearance: Normal appearance. She is well-developed. She is obese. She is not ill-appearing.  HENT:     Head: Normocephalic and atraumatic.  Eyes:     Conjunctiva/sclera: Conjunctivae normal.     Pupils: Pupils are equal, round, and reactive to light.  Neck:     Thyroid: No thyromegaly.  Cardiovascular:     Rate and Rhythm: Normal rate and regular rhythm.     Pulses: Normal pulses.     Heart sounds: Normal heart sounds. No murmur heard. Pulmonary:     Effort: Pulmonary effort is normal. No respiratory distress.     Breath sounds: Normal breath sounds.  Abdominal:     General: There is no distension.     Palpations: Abdomen is soft.     Tenderness: There is no abdominal tenderness.  Musculoskeletal:     Cervical back: Normal range of motion and neck supple.     Right lower leg: No edema.     Left lower leg: No edema.  Lymphadenopathy:     Cervical: No cervical adenopathy.  Skin:    General: Skin is warm and dry.  Neurological:     General: No focal deficit present.     Mental Status: She is alert and oriented to person, place, and time.  Psychiatric:        Mood and Affect: Mood normal.        Behavior: Behavior normal.            Assessment & Plan:

## 2023-01-06 NOTE — Assessment & Plan Note (Signed)
Ongoing issue.  Pt has gained 5 lbs since last visit.  BMI now 36.82  Pt is attempting to walk when she is able but has a lot of stress right now.  Knows she needs to make self care a higher priority

## 2023-01-07 ENCOUNTER — Telehealth: Payer: Self-pay

## 2023-01-07 NOTE — Telephone Encounter (Signed)
Pt seen results Via my chart  

## 2023-01-07 NOTE — Telephone Encounter (Signed)
-----   Message from Midge Minium, MD sent at 01/07/2023  7:36 AM EST ----- Labs look great!  No changes at this time

## 2023-01-10 ENCOUNTER — Other Ambulatory Visit: Payer: Self-pay

## 2023-01-11 ENCOUNTER — Other Ambulatory Visit (HOSPITAL_COMMUNITY): Payer: Self-pay

## 2023-01-19 ENCOUNTER — Other Ambulatory Visit: Payer: Self-pay | Admitting: Family Medicine

## 2023-01-20 NOTE — Telephone Encounter (Signed)
Xanax 0.5 mg LOV: 01/06/23 Last Refill:06/08/22 Upcoming appt: 06/20/23

## 2023-01-21 ENCOUNTER — Other Ambulatory Visit (HOSPITAL_BASED_OUTPATIENT_CLINIC_OR_DEPARTMENT_OTHER): Payer: Self-pay

## 2023-01-21 ENCOUNTER — Other Ambulatory Visit: Payer: Self-pay

## 2023-01-21 MED ORDER — ALPRAZOLAM 0.5 MG PO TABS
0.5000 mg | ORAL_TABLET | Freq: Two times a day (BID) | ORAL | 1 refills | Status: DC | PRN
  Filled 2023-01-21: qty 30, 15d supply, fill #0

## 2023-01-21 NOTE — Telephone Encounter (Signed)
Left VM that Rx has been sent in

## 2023-01-24 ENCOUNTER — Other Ambulatory Visit: Payer: Self-pay

## 2023-01-26 ENCOUNTER — Other Ambulatory Visit (HOSPITAL_COMMUNITY): Payer: Self-pay

## 2023-02-24 ENCOUNTER — Other Ambulatory Visit: Payer: Self-pay | Admitting: Family Medicine

## 2023-02-24 ENCOUNTER — Other Ambulatory Visit: Payer: Self-pay

## 2023-02-24 DIAGNOSIS — F419 Anxiety disorder, unspecified: Secondary | ICD-10-CM

## 2023-02-24 MED ORDER — ATORVASTATIN CALCIUM 10 MG PO TABS
10.0000 mg | ORAL_TABLET | Freq: Every day | ORAL | 0 refills | Status: DC
  Filled 2023-02-24: qty 90, 90d supply, fill #0

## 2023-02-24 MED ORDER — SERTRALINE HCL 100 MG PO TABS
100.0000 mg | ORAL_TABLET | Freq: Every day | ORAL | 1 refills | Status: DC
  Filled 2023-02-24: qty 90, 90d supply, fill #0
  Filled 2023-05-26: qty 90, 90d supply, fill #1

## 2023-03-16 ENCOUNTER — Other Ambulatory Visit (HOSPITAL_COMMUNITY): Payer: Self-pay

## 2023-03-23 ENCOUNTER — Encounter: Payer: Self-pay | Admitting: Internal Medicine

## 2023-04-29 ENCOUNTER — Other Ambulatory Visit: Payer: Self-pay

## 2023-04-29 ENCOUNTER — Other Ambulatory Visit: Payer: Self-pay | Admitting: Family Medicine

## 2023-04-29 ENCOUNTER — Other Ambulatory Visit (HOSPITAL_COMMUNITY): Payer: Self-pay

## 2023-04-29 MED ORDER — PANTOPRAZOLE SODIUM 40 MG PO TBEC
40.0000 mg | DELAYED_RELEASE_TABLET | Freq: Every day | ORAL | 1 refills | Status: DC
  Filled 2023-04-29: qty 90, 90d supply, fill #0
  Filled 2023-07-26: qty 90, 90d supply, fill #1

## 2023-05-12 ENCOUNTER — Encounter: Payer: Self-pay | Admitting: Internal Medicine

## 2023-05-19 ENCOUNTER — Other Ambulatory Visit (HOSPITAL_COMMUNITY): Payer: Self-pay

## 2023-05-26 ENCOUNTER — Other Ambulatory Visit: Payer: Self-pay | Admitting: Family Medicine

## 2023-05-27 ENCOUNTER — Other Ambulatory Visit: Payer: Self-pay

## 2023-05-27 ENCOUNTER — Other Ambulatory Visit (HOSPITAL_COMMUNITY): Payer: Self-pay

## 2023-05-27 MED ORDER — METFORMIN HCL 500 MG PO TABS
500.0000 mg | ORAL_TABLET | Freq: Every day | ORAL | 1 refills | Status: DC
  Filled 2023-05-27: qty 90, 90d supply, fill #0
  Filled 2023-09-01: qty 90, 90d supply, fill #1

## 2023-05-27 MED ORDER — ATORVASTATIN CALCIUM 10 MG PO TABS
10.0000 mg | ORAL_TABLET | Freq: Every day | ORAL | 0 refills | Status: DC
  Filled 2023-05-27: qty 90, 90d supply, fill #0

## 2023-06-10 ENCOUNTER — Other Ambulatory Visit: Payer: Self-pay

## 2023-06-10 ENCOUNTER — Ambulatory Visit (AMBULATORY_SURGERY_CENTER): Payer: 59

## 2023-06-10 VITALS — Ht 65.0 in | Wt 204.0 lb

## 2023-06-10 DIAGNOSIS — Z1211 Encounter for screening for malignant neoplasm of colon: Secondary | ICD-10-CM

## 2023-06-10 MED ORDER — NA SULFATE-K SULFATE-MG SULF 17.5-3.13-1.6 GM/177ML PO SOLN
1.0000 | Freq: Once | ORAL | 0 refills | Status: AC
  Filled 2023-06-10: qty 354, 1d supply, fill #0

## 2023-06-10 NOTE — Progress Notes (Signed)
No egg or soy allergy known to patient  No issues known to pt with past sedation with any surgeries or procedures Patient denies ever being told they had issues or difficulty with intubation  No FH of Malignant Hyperthermia Pt is not on diet pills Pt is not on  home 02  Pt is not on blood thinners  Pt denies issues with constipation  No A fib or A flutter Have any cardiac testing pending--no  LOA: independent  Prep: suprep   Patient's chart reviewed by Tracy Pitts CNRA prior to previsit and patient appropriate for the [facility].  Previsit completed and red dot placed by patient's name on their procedure day (on provider's schedule).     PV competed with patient. Prep instructions sent via mychart and home address.

## 2023-06-19 ENCOUNTER — Other Ambulatory Visit: Payer: Self-pay | Admitting: Family Medicine

## 2023-06-20 ENCOUNTER — Ambulatory Visit: Payer: 59 | Admitting: Family Medicine

## 2023-06-20 ENCOUNTER — Other Ambulatory Visit (HOSPITAL_COMMUNITY): Payer: Self-pay

## 2023-06-20 ENCOUNTER — Encounter: Payer: Self-pay | Admitting: Family Medicine

## 2023-06-20 VITALS — BP 124/70 | HR 62 | Temp 97.8°F | Resp 18 | Ht 65.0 in | Wt 223.5 lb

## 2023-06-20 DIAGNOSIS — Z0000 Encounter for general adult medical examination without abnormal findings: Secondary | ICD-10-CM

## 2023-06-20 DIAGNOSIS — E669 Obesity, unspecified: Secondary | ICD-10-CM

## 2023-06-20 DIAGNOSIS — Z23 Encounter for immunization: Secondary | ICD-10-CM

## 2023-06-20 DIAGNOSIS — E559 Vitamin D deficiency, unspecified: Secondary | ICD-10-CM | POA: Diagnosis not present

## 2023-06-20 LAB — CBC WITH DIFFERENTIAL/PLATELET
Basophils Absolute: 0 K/uL (ref 0.0–0.1)
Basophils Relative: 1.1 % (ref 0.0–3.0)
Eosinophils Absolute: 0.1 K/uL (ref 0.0–0.7)
Eosinophils Relative: 2.2 % (ref 0.0–5.0)
HCT: 43.7 % (ref 36.0–46.0)
Hemoglobin: 14.1 g/dL (ref 12.0–15.0)
Lymphocytes Relative: 27.9 % (ref 12.0–46.0)
Lymphs Abs: 1.3 K/uL (ref 0.7–4.0)
MCHC: 32.4 g/dL (ref 30.0–36.0)
MCV: 95.3 fl (ref 78.0–100.0)
Monocytes Absolute: 0.4 K/uL (ref 0.1–1.0)
Monocytes Relative: 8.6 % (ref 3.0–12.0)
Neutro Abs: 2.8 K/uL (ref 1.4–7.7)
Neutrophils Relative %: 60.2 % (ref 43.0–77.0)
Platelets: 218 K/uL (ref 150.0–400.0)
RBC: 4.58 Mil/uL (ref 3.87–5.11)
RDW: 13.6 % (ref 11.5–15.5)
WBC: 4.6 K/uL (ref 4.0–10.5)

## 2023-06-20 LAB — LIPID PANEL
Cholesterol: 201 mg/dL — ABNORMAL HIGH (ref 0–200)
HDL: 77.5 mg/dL (ref >39.00)
LDL Cholesterol: 114 mg/dL — ABNORMAL HIGH (ref 0–99)
NonHDL: 123.72
Total CHOL/HDL Ratio: 3
Triglycerides: 50 mg/dL (ref 0.0–149.0)
VLDL: 10 mg/dL (ref 0.0–40.0)

## 2023-06-20 LAB — HEPATIC FUNCTION PANEL
ALT: 23 U/L (ref 0–35)
AST: 17 U/L (ref 0–37)
Albumin: 4.4 g/dL (ref 3.5–5.2)
Alkaline Phosphatase: 61 U/L (ref 39–117)
Bilirubin, Direct: 0.1 mg/dL (ref 0.0–0.3)
Total Bilirubin: 0.4 mg/dL (ref 0.2–1.2)
Total Protein: 6.9 g/dL (ref 6.0–8.3)

## 2023-06-20 LAB — BASIC METABOLIC PANEL
BUN: 22 mg/dL (ref 6–23)
CO2: 29 mEq/L (ref 19–32)
Calcium: 9.4 mg/dL (ref 8.4–10.5)
Chloride: 104 mEq/L (ref 96–112)
Creatinine, Ser: 0.84 mg/dL (ref 0.40–1.20)
GFR: 75 mL/min (ref >60.00)
Glucose, Bld: 93 mg/dL (ref 70–99)
Potassium: 4.5 mEq/L (ref 3.5–5.1)
Sodium: 140 mEq/L (ref 135–145)

## 2023-06-20 LAB — VITAMIN D 25 HYDROXY (VIT D DEFICIENCY, FRACTURES): VITD: 57.06 ng/mL (ref 30.00–100.00)

## 2023-06-20 LAB — TSH: TSH: 1.93 u[IU]/mL (ref 0.35–5.50)

## 2023-06-20 MED ORDER — MELOXICAM 15 MG PO TABS
15.0000 mg | ORAL_TABLET | Freq: Every day | ORAL | 1 refills | Status: DC
  Filled 2023-06-20: qty 90, 90d supply, fill #0
  Filled 2023-10-17: qty 90, 90d supply, fill #1

## 2023-06-20 NOTE — Patient Instructions (Addendum)
Follow up in 6 months to recheck BP and cholesterol We'll notify you of your lab results and make any changes Give yourself some grace during all of this!  You've got a lot of your plate! Try and take care of yourself during this difficult time- sometimes a walk can do wonders! Call with any questions or concerns Stay Safe!  Stay Healthy! Hang in there!!

## 2023-06-20 NOTE — Progress Notes (Signed)
   Subjective:    Patient ID: Tracy Pitts, female    DOB: May 19, 1961, 62 y.o.   MRN: [id number]  HPI CPE- due for colonoscopy (scheduled), UTD on mammo, pap, Tdap.  Will get shingrix today  Patient Care Team    Relationship Specialty Notifications Start End  Sheliah Hatch, MD PCP - General Family Medicine  05/20/17   Quintella Reichert, MD PCP - Cardiology Cardiology Admissions 06/08/18   Huel Cote, MD Consulting Physician Obstetrics and Gynecology  05/20/17   Hilarie Fredrickson, MD Consulting Physician Gastroenterology  05/20/17     Health Maintenance  Topic Date Due   Zoster Vaccines- Shingrix (2 of 2) 08/12/2022   Colonoscopy  02/22/2023   INFLUENZA VACCINE  06/23/2023   MAMMOGRAM  09/04/2023   PAP SMEAR-Modifier  09/03/2024   DTaP/Tdap/Td (2 - Td or Tdap) 02/18/2027   Hepatitis C Screening  Completed   HIV Screening  Completed   HPV VACCINES  Aged Out   COVID-19 Vaccine  Discontinued      Review of Systems Patient reports no vision/ hearing changes, adenopathy,fever, weight change,  persistant/recurrent hoarseness , swallowing issues, chest pain, palpitations, edema, persistant/recurrent cough, hemoptysis, dyspnea (rest/exertional/paroxysmal nocturnal), gastrointestinal bleeding (melena, rectal bleeding), abdominal pain, significant heartburn, bowel changes, GU symptoms (dysuria, hematuria, incontinence), Gyn symptoms (abnormal  bleeding, pain),  syncope, focal weakness, memory loss, numbness & tingling, skin/hair/nail changes, abnormal bruising or bleeding, anxiety, or depression.     Objective:   Physical Exam General Appearance:    Alert, cooperative, no distress, appears stated age  Head:    Normocephalic, without obvious abnormality, atraumatic  Eyes:    PERRL, conjunctiva/corneas clear, EOM's intact both eyes  Ears:    Normal TM's and external ear canals, both ears  Nose:   Nares normal, septum midline, mucosa normal, no drainage    or sinus tenderness   Throat:   Lips, mucosa, and tongue normal; teeth and gums normal  Neck:   Supple, symmetrical, trachea midline, no adenopathy;    Thyroid: no enlargement/tenderness/nodules  Back:     Symmetric, no curvature, ROM normal, no CVA tenderness  Lungs:     Clear to auscultation bilaterally, respirations unlabored  Chest Wall:    No tenderness or deformity   Heart:    Regular rate and rhythm, S1 and S2 normal, no murmur, rub   or gallop  Breast Exam:    Deferred to mammo  Abdomen:     Soft, non-tender, bowel sounds active all four quadrants,    no masses, no organomegaly  Genitalia:    Deferred to GYN  Rectal:    Extremities:   Extremities normal, atraumatic, no cyanosis or edema  Pulses:   2+ and symmetric all extremities  Skin:   Skin color, texture, turgor normal, no rashes or lesions  Lymph nodes:   Cervical, supraclavicular, and axillary nodes normal  Neurologic:   CNII-XII intact, normal strength, sensation and reflexes    throughout          Assessment & Plan:

## 2023-06-20 NOTE — Assessment & Plan Note (Signed)
Ongoing issue for pt but she is under considerable stress w/ her husband's stage 4 lung cancer and the recent passing of her father.  Encouraged her to give herself some grace during this hard time.  Check labs to risk stratify.  Will follow.

## 2023-06-20 NOTE — Assessment & Plan Note (Signed)
Check labs and replete prn. 

## 2023-06-20 NOTE — Assessment & Plan Note (Signed)
Pt's PE WNL w/ exception of BMI.  Colonoscopy scheduled.  UTD on pap, mammo, Tdap.  2nd Shingrix given today.  Check labs.  Anticipatory guidance provided.

## 2023-06-21 ENCOUNTER — Telehealth: Payer: Self-pay

## 2023-06-21 NOTE — Telephone Encounter (Signed)
-----   Message from Neena Rhymes sent at 06/21/2023  7:18 AM EDT ----- Labs look great!  No changes at this time

## 2023-06-21 NOTE — Telephone Encounter (Signed)
Pt seen results Via my chart  

## 2023-06-23 ENCOUNTER — Encounter: Payer: Self-pay | Admitting: Internal Medicine

## 2023-07-05 ENCOUNTER — Ambulatory Visit: Payer: 59 | Admitting: Internal Medicine

## 2023-07-05 ENCOUNTER — Encounter: Payer: Self-pay | Admitting: Internal Medicine

## 2023-07-05 VITALS — BP 110/77 | HR 57 | Temp 98.2°F | Resp 11 | Ht 65.0 in | Wt 204.0 lb

## 2023-07-05 DIAGNOSIS — Z1211 Encounter for screening for malignant neoplasm of colon: Secondary | ICD-10-CM

## 2023-07-05 DIAGNOSIS — D123 Benign neoplasm of transverse colon: Secondary | ICD-10-CM | POA: Diagnosis not present

## 2023-07-05 DIAGNOSIS — F419 Anxiety disorder, unspecified: Secondary | ICD-10-CM | POA: Diagnosis not present

## 2023-07-05 DIAGNOSIS — D122 Benign neoplasm of ascending colon: Secondary | ICD-10-CM | POA: Diagnosis not present

## 2023-07-05 DIAGNOSIS — K635 Polyp of colon: Secondary | ICD-10-CM | POA: Diagnosis not present

## 2023-07-05 DIAGNOSIS — E669 Obesity, unspecified: Secondary | ICD-10-CM | POA: Diagnosis not present

## 2023-07-05 DIAGNOSIS — F32A Depression, unspecified: Secondary | ICD-10-CM | POA: Diagnosis not present

## 2023-07-05 MED ORDER — SODIUM CHLORIDE 0.9 % IV SOLN: 500.0000 mL | Freq: Once | INTRAVENOUS | Status: DC

## 2023-07-05 NOTE — Op Note (Signed)
[facility] Patient Name: Tracy Pitts Procedure Date: 07/05/2023 7:58 AM MRN: [id number] Endoscopist: Wilhemina Bonito. Marina Goodell , MD, [id number] Age: 62 Referring MD:  Date of Birth: 31-Dec-1960 Gender: Female Account #: [id number] Procedure:                Colonoscopy with cold snare polypectomy x 1; biopsy                            polypectomy x 1 Indications:              Screening for colorectal malignant neoplasm. Index                            exam 2014 was negative for neoplasia Medicines:                Monitored Anesthesia Care Procedure:                Pre-Anesthesia Assessment:                           - Prior to the procedure, a History and Physical                            was performed, and patient medications and                            allergies were reviewed. The patient's tolerance of                            previous anesthesia was also reviewed. The risks                            and benefits of the procedure and the sedation                            options and risks were discussed with the patient.                            All questions were answered, and informed consent                            was obtained. Prior Anticoagulants: The patient has                            taken no anticoagulant or antiplatelet agents. ASA                            Grade Assessment: II - A patient with mild systemic                            disease. After reviewing the risks and benefits,                            the patient was deemed in satisfactory condition to  undergo the procedure.                           After obtaining informed consent, the colonoscope                            was passed under direct vision. Throughout the                            procedure, the patient's blood pressure, pulse, and                            oxygen saturations were monitored continuously. The                            CF [id number]  #[id number] was introduced through the anus                            and advanced to the the cecum, identified by                            appendiceal orifice and ileocecal valve. The                            ileocecal valve, appendiceal orifice, and rectum                            were photographed. The quality of the bowel                            preparation was excellent. The colonoscopy was                            performed without difficulty. The patient tolerated                            the procedure well. The bowel preparation used was                            SUPREP via split dose instruction. Scope In: 8:27:22 AM Scope Out: 8:43:18 AM Scope Withdrawal Time: 0 hours 13 minutes 11 seconds  Total Procedure Duration: 0 hours 15 minutes 56 seconds  Findings:                 A 3 mm polyp was found in the ascending colon. The                            polyp was sessile. The polyp was removed with a                            cold snare. Resection and retrieval were complete.                           A less than 1 mm polyp was found  in the hepatic                            flexure. The polyp was removed with a jumbo cold                            forceps. Resection and retrieval were complete.                           Multiple diverticula were found in the sigmoid                            colon.                           Non-bleeding internal hemorrhoids were found during                            retroflexion.                           The exam was otherwise without abnormality on                            direct and retroflexion views. Complications:            No immediate complications. Estimated blood loss:                            None. Estimated Blood Loss:     Estimated blood loss: none. Impression:               - One 3 mm polyp in the ascending colon, removed                            with a cold snare. Resected and retrieved.                            - One less than 1 mm polyp at the hepatic flexure,                            removed with a jumbo cold forceps. Resected and                            retrieved.                           - Diverticulosis in the sigmoid colon.                           - Non-bleeding internal hemorrhoids.                           - The examination was otherwise normal on direct                            and retroflexion views.  Recommendation:           - Repeat colonoscopy in 7-10 years for surveillance.                           - Patient has a contact number available for                            emergencies. The signs and symptoms of potential                            delayed complications were discussed with the                            patient. Return to normal activities tomorrow.                            Written discharge instructions were provided to the                            patient.                           - Resume previous diet.                           - Continue present medications.                           - Await pathology results. Wilhemina Bonito. Marina Goodell, MD 07/05/2023 9:39:04 AM This report has been signed electronically.

## 2023-07-05 NOTE — Progress Notes (Signed)
Sedate, gd SR, tolerated procedure well, VSS, report to RN 

## 2023-07-05 NOTE — Progress Notes (Signed)
HISTORY OF PRESENT ILLNESS:  Tracy Pitts is a 62 y.o. female who presents today for routine screening colonoscopy.  Previous examination 2014 was negative for neoplasia  REVIEW OF SYSTEMS:  All non-GI ROS negative except for  Past Medical History:  Diagnosis Date   Allergy    Anxiety    Arthritis    LEFT HIP    Complication of anesthesia    SOME NAUSEA WITH PRIOR C SECTION   Depression    Dr Lyda Perone   GERD (gastroesophageal reflux disease)    PONV (postoperative nausea and vomiting)    Pre-diabetes    Prediabetes    Wears glasses     Past Surgical History:  Procedure Laterality Date   BREAST BIOPSY Right 1989    Benign   CESAREAN SECTION  1994   CHOLECYSTECTOMY N/A 02/17/2015   Procedure: LAPAROSCOPIC CHOLECYSTECTOMY WITH INTRAOPERATIVE CHOLANGIOGRAM;  Surgeon: Darnell Level, MD;  Location: [facility];  Service: General;  Laterality: N/A;   COLONOSCOPY WITH PROPOFOL  02-21-2013   JOINT REPLACEMENT     TOTAL HIP ARTHROPLASTY Left 06/20/2018   Procedure: LEFT TOTAL HIP ARTHROPLASTY ANTERIOR APPROACH;  Surgeon: Durene Romans, MD;  Location: [facility];  Service: Orthopedics;  Laterality: Left;  70 mins   TOTAL HIP ARTHROPLASTY Right 07/08/2020   Procedure: TOTAL HIP ARTHROPLASTY ANTERIOR APPROACH;  Surgeon: Durene Romans, MD;  Location: [facility];  Service: Orthopedics;  Laterality: Right;  70 mins    Social History AUNESTY WITTEMAN  reports that she has never smoked. She has never used smokeless tobacco. She reports current alcohol use. She reports that she does not use drugs.  family history includes Aortic aneurysm in her paternal grandfather; Arthritis in her father, maternal grandfather, mother, paternal grandfather, and paternal grandmother; Atrial fibrillation in her father; Breast cancer in her maternal aunt; Cancer in her maternal aunt, maternal grandmother, and paternal aunt; Cancer - Other in her maternal aunt; Congenital heart disease in her paternal aunt;  Depression in her father, mother, and sister; Heart disease in her paternal aunt; Hyperlipidemia in her father, maternal aunt, maternal aunt, maternal grandfather, maternal uncle, mother, paternal aunt, paternal grandfather, paternal grandmother, paternal uncle, and paternal uncle; Hypertension in her father, maternal aunt, maternal aunt, maternal uncle, mother, paternal aunt, paternal grandfather, paternal grandmother, paternal uncle, and sister.  No Known Allergies     PHYSICAL EXAMINATION: Vital signs: BP (!) 165/92   Pulse 68   Temp 98.2 F (36.8 C)   Resp 13   Ht 5\' 5"  (1.651 m)   Wt 204 lb (92.5 kg)   LMP 10/07/2011   SpO2 99%   BMI 33.95 kg/m  General: Well-developed, well-nourished, no acute distress HEENT: Sclerae are anicteric, conjunctiva pink. Oral mucosa intact Lungs: Clear Heart: Regular Abdomen: soft, nontender, nondistended, no obvious ascites, no peritoneal signs, normal bowel sounds. No organomegaly. Extremities: No edema Psychiatric: alert and oriented x3. Cooperative     ASSESSMENT:  Colon cancer screening   PLAN:   Screening colonoscopy

## 2023-07-05 NOTE — Progress Notes (Signed)
Pt's states no medical or surgical changes since previsit or office visit. 

## 2023-07-05 NOTE — Patient Instructions (Signed)
Discharge instructions given. Handouts on polyps,diverticulosis and hemorrhoids. Resume previous medications. YOU HAD AN ENDOSCOPIC PROCEDURE TODAY AT [facility]:   Refer to the procedure report that was given to you for any specific questions about what was found during the examination.  If the procedure report does not answer your questions, please call your gastroenterologist to clarify.  If you requested that your care partner not be given the details of your procedure findings, then the procedure report has been included in a sealed envelope for you to review at your convenience later.  YOU SHOULD EXPECT: Some feelings of bloating in the abdomen. Passage of more gas than usual.  Walking can help get rid of the air that was put into your GI tract during the procedure and reduce the bloating. If you had a lower endoscopy (such as a colonoscopy or flexible sigmoidoscopy) you may notice spotting of blood in your stool or on the toilet paper. If you underwent a bowel prep for your procedure, you may not have a normal bowel movement for a few days.  Please Note:  You might notice some irritation and congestion in your nose or some drainage.  This is from the oxygen used during your procedure.  There is no need for concern and it should clear up in a day or so.  SYMPTOMS TO REPORT IMMEDIATELY:  Following lower endoscopy (colonoscopy or flexible sigmoidoscopy):  Excessive amounts of blood in the stool  Significant tenderness or worsening of abdominal pains  Swelling of the abdomen that is new, acute  Fever of 100F or higher  Following upper endoscopy (EGD)  Vomiting of blood or coffee ground material  New chest pain or pain under the shoulder blades  Painful or persistently difficult swallowing  New shortness of breath  Fever of 100F or higher  Black, tarry-looking stools  For urgent or emergent issues, a gastroenterologist can be reached at any hour by calling [phone]  [phone]. Do not use MyChart messaging for urgent concerns.    DIET:  We do recommend a small meal at first, but then you may proceed to your regular diet.  Drink plenty of fluids but you should avoid alcoholic beverages for 24 hours.  ACTIVITY:  You should plan to take it easy for the rest of today and you should NOT DRIVE or use heavy machinery until tomorrow (because of the sedation medicines used during the test).    FOLLOW UP: Our staff will call the number listed on your records the next business day following your procedure.  We will call around 7:15- 8:00 am to check on you and address any questions or concerns that you may have regarding the information given to you following your procedure. If we do not reach you, we will leave a message.     If any biopsies were taken you will be contacted by phone or by letter within the next 1-3 weeks.  Please call us at [phone] if you have not heard about the biopsies in 3 weeks.    SIGNATURES/CONFIDENTIALITY: You and/or your care partner have signed paperwork which will be entered into your electronic medical record.  These signatures attest to the fact that that the information above on your After Visit Summary has been reviewed and is understood.  Full responsibility of the confidentiality of this discharge information lies with you and/or your care-partner.

## 2023-07-05 NOTE — Progress Notes (Signed)
Called to room to assist during endoscopic procedure.  Patient ID and intended procedure confirmed with present staff. Received instructions for my participation in the procedure from the performing physician.  

## 2023-07-06 ENCOUNTER — Telehealth: Payer: Self-pay | Admitting: *Deleted

## 2023-07-06 NOTE — Telephone Encounter (Signed)
  Follow up Call-     07/05/2023    7:21 AM  Call back number  Post procedure Call Back phone  # [phone]  Permission to leave phone message Yes     Patient questions:  Do you have a fever, pain , or abdominal swelling? No. Pain Score  0 *  Have you tolerated food without any problems? Yes.    Have you been able to return to your normal activities? Yes.    Do you have any questions about your discharge instructions: Diet   No. Medications  No. Follow up visit  No.  Do you have questions or concerns about your Care? No.  Actions: * If pain score is 4 or above: No action needed, pain <4.

## 2023-07-13 ENCOUNTER — Encounter: Payer: Self-pay | Admitting: Internal Medicine

## 2023-07-26 ENCOUNTER — Other Ambulatory Visit (HOSPITAL_COMMUNITY): Payer: Self-pay

## 2023-07-26 ENCOUNTER — Other Ambulatory Visit: Payer: Self-pay | Admitting: Family Medicine

## 2023-07-27 ENCOUNTER — Other Ambulatory Visit: Payer: Self-pay

## 2023-09-01 ENCOUNTER — Other Ambulatory Visit: Payer: Self-pay

## 2023-09-01 ENCOUNTER — Other Ambulatory Visit: Payer: Self-pay | Admitting: Family Medicine

## 2023-09-01 DIAGNOSIS — F419 Anxiety disorder, unspecified: Secondary | ICD-10-CM

## 2023-09-01 MED ORDER — ATORVASTATIN CALCIUM 10 MG PO TABS
10.0000 mg | ORAL_TABLET | Freq: Every day | ORAL | 0 refills | Status: DC
  Filled 2023-09-01: qty 90, 90d supply, fill #0

## 2023-09-01 MED ORDER — BUPROPION HCL ER (XL) 300 MG PO TB24
300.0000 mg | ORAL_TABLET | Freq: Every day | ORAL | 3 refills | Status: DC
  Filled 2023-09-01: qty 90, 90d supply, fill #0
  Filled 2023-12-01: qty 90, 90d supply, fill #1
  Filled 2024-02-20: qty 90, 90d supply, fill #2
  Filled 2024-05-18: qty 90, 90d supply, fill #3

## 2023-09-01 MED ORDER — SERTRALINE HCL 100 MG PO TABS
100.0000 mg | ORAL_TABLET | Freq: Every day | ORAL | 1 refills | Status: DC
Start: 2023-09-01 — End: 2024-02-20
  Filled 2023-09-01: qty 90, 90d supply, fill #0
  Filled 2023-12-01: qty 90, 90d supply, fill #1

## 2023-09-28 ENCOUNTER — Ambulatory Visit: Payer: 59 | Admitting: Cardiology

## 2023-10-11 ENCOUNTER — Other Ambulatory Visit (HOSPITAL_COMMUNITY): Payer: Self-pay

## 2023-10-17 ENCOUNTER — Other Ambulatory Visit (HOSPITAL_COMMUNITY): Payer: Self-pay

## 2023-10-17 ENCOUNTER — Other Ambulatory Visit: Payer: Self-pay

## 2023-10-17 ENCOUNTER — Other Ambulatory Visit: Payer: Self-pay | Admitting: Family Medicine

## 2023-10-17 MED ORDER — ALPRAZOLAM 0.5 MG PO TABS
0.5000 mg | ORAL_TABLET | Freq: Two times a day (BID) | ORAL | 1 refills | Status: DC | PRN
  Filled 2023-10-17: qty 30, 15d supply, fill #0
  Filled 2024-01-16: qty 30, 15d supply, fill #1

## 2023-10-17 MED ORDER — PANTOPRAZOLE SODIUM 40 MG PO TBEC
40.0000 mg | DELAYED_RELEASE_TABLET | Freq: Every day | ORAL | 1 refills | Status: DC
  Filled 2023-10-17: qty 90, 90d supply, fill #0
  Filled 2024-01-16: qty 90, 90d supply, fill #1

## 2023-10-17 NOTE — Telephone Encounter (Signed)
Requested Prescriptions   Pending Prescriptions Disp Refills   ALPRAZolam (XANAX) 0.5 MG tablet 30 tablet 1    Sig: Take 1 tablet by mouth 2 times daily as needed for anxiety.   pantoprazole (PROTONIX) 40 MG tablet 90 tablet 1    Sig: Take 1 tablet (40 mg total) by mouth daily.     Date of patient request: 10/17/2023 06/20/2023 12/22/2023 Date of last refill: 01/21/2023 Last refill amount: 30

## 2023-11-24 ENCOUNTER — Other Ambulatory Visit (HOSPITAL_COMMUNITY): Payer: Self-pay

## 2023-12-01 ENCOUNTER — Other Ambulatory Visit (HOSPITAL_COMMUNITY): Payer: Self-pay

## 2023-12-01 ENCOUNTER — Other Ambulatory Visit: Payer: Self-pay | Admitting: Family Medicine

## 2023-12-01 ENCOUNTER — Other Ambulatory Visit: Payer: Self-pay

## 2023-12-01 MED ORDER — ATORVASTATIN CALCIUM 10 MG PO TABS
10.0000 mg | ORAL_TABLET | Freq: Every day | ORAL | 0 refills | Status: DC
  Filled 2023-12-01: qty 90, 90d supply, fill #0

## 2023-12-01 MED ORDER — METFORMIN HCL 500 MG PO TABS
500.0000 mg | ORAL_TABLET | Freq: Every day | ORAL | 1 refills | Status: DC
  Filled 2023-12-01: qty 90, 90d supply, fill #0
  Filled 2024-02-20: qty 90, 90d supply, fill #1

## 2023-12-20 ENCOUNTER — Other Ambulatory Visit: Payer: Self-pay

## 2023-12-20 ENCOUNTER — Other Ambulatory Visit (HOSPITAL_COMMUNITY): Payer: Self-pay

## 2023-12-22 ENCOUNTER — Other Ambulatory Visit: Payer: Self-pay

## 2023-12-22 ENCOUNTER — Ambulatory Visit: Payer: Commercial Managed Care - PPO | Admitting: Family Medicine

## 2023-12-22 VITALS — BP 134/80 | HR 71 | Temp 98.1°F | Ht 65.0 in | Wt 219.2 lb

## 2023-12-22 DIAGNOSIS — R7303 Prediabetes: Secondary | ICD-10-CM | POA: Diagnosis not present

## 2023-12-22 DIAGNOSIS — R1013 Epigastric pain: Secondary | ICD-10-CM | POA: Diagnosis not present

## 2023-12-22 DIAGNOSIS — E785 Hyperlipidemia, unspecified: Secondary | ICD-10-CM

## 2023-12-22 LAB — BASIC METABOLIC PANEL
BUN: 19 mg/dL (ref 6–23)
CO2: 28 meq/L (ref 19–32)
Calcium: 9.5 mg/dL (ref 8.4–10.5)
Chloride: 103 meq/L (ref 96–112)
Creatinine, Ser: 0.83 mg/dL (ref 0.40–1.20)
GFR: 75.82 mL/min (ref >60.00)
Glucose, Bld: 89 mg/dL (ref 70–99)
Potassium: 4.3 meq/L (ref 3.5–5.1)
Sodium: 140 meq/L (ref 135–145)

## 2023-12-22 LAB — HEMOGLOBIN A1C: Hgb A1c MFr Bld: 5.8 % (ref 4.6–6.5)

## 2023-12-22 LAB — HEPATIC FUNCTION PANEL
ALT: 23 U/L (ref 0–35)
AST: 19 U/L (ref 0–37)
Albumin: 4.5 g/dL (ref 3.5–5.2)
Alkaline Phosphatase: 64 U/L (ref 39–117)
Bilirubin, Direct: 0.1 mg/dL (ref 0.0–0.3)
Total Bilirubin: 0.4 mg/dL (ref 0.2–1.2)
Total Protein: 7 g/dL (ref 6.0–8.3)

## 2023-12-22 LAB — CBC WITH DIFFERENTIAL/PLATELET
Basophils Absolute: 0.1 10*3/uL (ref 0.0–0.1)
Basophils Relative: 1.1 % (ref 0.0–3.0)
Eosinophils Absolute: 0.1 10*3/uL (ref 0.0–0.7)
Eosinophils Relative: 1.9 % (ref 0.0–5.0)
HCT: 44.3 % (ref 36.0–46.0)
Hemoglobin: 14.7 g/dL (ref 12.0–15.0)
Lymphocytes Relative: 25.2 % (ref 12.0–46.0)
Lymphs Abs: 1.1 10*3/uL (ref 0.7–4.0)
MCHC: 33.3 g/dL (ref 30.0–36.0)
MCV: 95.3 fL (ref 78.0–100.0)
Monocytes Absolute: 0.4 10*3/uL (ref 0.1–1.0)
Monocytes Relative: 9.2 % (ref 3.0–12.0)
Neutro Abs: 2.8 10*3/uL (ref 1.4–7.7)
Neutrophils Relative %: 62.6 % (ref 43.0–77.0)
Platelets: 216 10*3/uL (ref 150.0–400.0)
RBC: 4.65 Mil/uL (ref 3.87–5.11)
RDW: 13.4 % (ref 11.5–15.5)
WBC: 4.5 10*3/uL (ref 4.0–10.5)

## 2023-12-22 LAB — LIPID PANEL
Cholesterol: 193 mg/dL (ref 0–200)
HDL: 79.3 mg/dL (ref >39.00)
LDL Cholesterol: 100 mg/dL — ABNORMAL HIGH (ref 0–99)
NonHDL: 113.87
Total CHOL/HDL Ratio: 2
Triglycerides: 70 mg/dL (ref 0.0–149.0)
VLDL: 14 mg/dL (ref 0.0–40.0)

## 2023-12-22 LAB — LIPASE: Lipase: 33 U/L (ref 11.0–59.0)

## 2023-12-22 LAB — TSH: TSH: 2.15 u[IU]/mL (ref 0.35–5.50)

## 2023-12-22 MED ORDER — SUCRALFATE 1 G PO TABS
1.0000 g | ORAL_TABLET | Freq: Three times a day (TID) | ORAL | 0 refills | Status: AC
  Filled 2023-12-22: qty 120, 30d supply, fill #0

## 2023-12-22 NOTE — Patient Instructions (Signed)
Schedule your complete physical in 6 months We'll notify you of your lab results and make any changes if needed START the Sucralfate before each meal and before bed Try and limit the amount of carbonated beverages, alcohol, or spicy/acidic food Continue to work on healthy diet and regular exercise- you can do it! IF the stomach pain does not improve, let me know Call with any questions or concerns Stay Safe!  Stay Healthy! Hang in there!

## 2023-12-22 NOTE — Progress Notes (Signed)
   Subjective:    Patient ID: Tracy Pitts, female    DOB: 27-Apr-1961, 63 y.o.   MRN: [id number]  HPI Hyperlipidemia- chronic problem, on Lipitor 10mg  daily.  No CP, SOB  Obesity- pt has gained 15 lbs since August.  BMI now 36.49  Walking regularly.  Attempting to make better food choices but does stress eat.  Pre-diabetes- currently on Metformin 500mg  daily.    Epigastric pain- sxs started ~1 month ago.  Sxs will get worse w/ eating.  Denies increased heartburn or indigestion.  Currently on protonix.  Thinks there may be a relationship to dairy.  Occasional nausea.     Review of Systems For ROS see HPI     Objective:   Physical Exam Vitals reviewed.  Constitutional:      General: She is not in acute distress.    Appearance: Normal appearance. She is well-developed. She is obese. She is not ill-appearing.  HENT:     Head: Normocephalic and atraumatic.  Eyes:     Conjunctiva/sclera: Conjunctivae normal.     Pupils: Pupils are equal, round, and reactive to light.  Neck:     Thyroid: No thyromegaly.  Cardiovascular:     Rate and Rhythm: Normal rate and regular rhythm.     Pulses: Normal pulses.     Heart sounds: Normal heart sounds. No murmur heard. Pulmonary:     Effort: Pulmonary effort is normal. No respiratory distress.     Breath sounds: Normal breath sounds.  Abdominal:     General: There is no distension.     Palpations: Abdomen is soft.     Tenderness: There is no abdominal tenderness.  Musculoskeletal:     Cervical back: Normal range of motion and neck supple.     Right lower leg: No edema.     Left lower leg: No edema.  Lymphadenopathy:     Cervical: No cervical adenopathy.  Skin:    General: Skin is warm and dry.  Neurological:     Mental Status: She is alert and oriented to person, place, and time.  Psychiatric:        Behavior: Behavior normal.           Assessment & Plan:  Epigastric pain- new.  Given description of pt's pain- a gnawing  epigastric that worsens after eating- this is suspicious for gastritis.  She is already on PPI.  Will start Carafate before meals and before bed.  Discussed dietary changes that may help her sxs.  Will follow.

## 2023-12-22 NOTE — Assessment & Plan Note (Signed)
Deteriorated.  Pt has gained 15 lbs since August.  BMI now 36.49- and coupled w/ pre-diabetes and hyperlipidemia- this qualifies as morbidly obese.  Encouraged her to increase increase her physical activity, eat a low carb diet.  Also discussed emotional eating and suggested she find another way to relax/find comfort.  Will follow.

## 2023-12-22 NOTE — Assessment & Plan Note (Signed)
Pt has hx of gestational diabetes.  Currently on Metformin 500mg  daily.  Check A1C and adjust tx prn.

## 2023-12-22 NOTE — Assessment & Plan Note (Signed)
Chronic problem, on Lipitor 10mg  daily w/o difficulty.  Check labs.  Adjust meds prn

## 2023-12-23 ENCOUNTER — Other Ambulatory Visit: Payer: Self-pay

## 2023-12-26 ENCOUNTER — Telehealth: Payer: Self-pay

## 2023-12-26 ENCOUNTER — Encounter: Payer: Self-pay | Admitting: Family Medicine

## 2023-12-26 NOTE — Telephone Encounter (Signed)
 Pt has reviewed labs via MyChart

## 2023-12-26 NOTE — Telephone Encounter (Signed)
-----   Message from Neena Rhymes sent at 12/26/2023  7:39 AM EST ----- Labs look great!  No changes at this time

## 2024-01-10 ENCOUNTER — Telehealth: Payer: Commercial Managed Care - PPO | Admitting: Physician Assistant

## 2024-01-10 DIAGNOSIS — R6889 Other general symptoms and signs: Secondary | ICD-10-CM | POA: Diagnosis not present

## 2024-01-10 MED ORDER — OSELTAMIVIR PHOSPHATE 75 MG PO CAPS: 75.0000 mg | ORAL_CAPSULE | Freq: Two times a day (BID) | ORAL | 0 refills | Status: DC

## 2024-01-10 NOTE — Progress Notes (Signed)
E visit for Flu like symptoms   We are sorry that you are not feeling well.  Here is how we plan to help! Based on what you have shared with me it looks like you may have  influenza.  Influenza or "the flu" is   an infection caused by a respiratory virus. The flu virus is highly contagious and persons who did not receive their yearly flu vaccination may "catch" the flu from close contact.  We have anti-viral medications to treat the viruses that cause this infection. They are not a "cure" and only shorten the course of the infection. These prescriptions are most effective when they are given within the first 2 days of "flu" symptoms. Antiviral medication are indicated if you have a high risk of complications from the flu. You should  also consider an antiviral medication if you are in close contact with someone who is at risk. These medications can help patients avoid complications from the flu  but have side effects that you should know. Possible side effects from Tamiflu or oseltamivir include nausea, vomiting, diarrhea, dizziness, headaches, eye redness, sleep problems or other respiratory symptoms. You should not take Tamiflu if you have an allergy to oseltamivir or any to the ingredients in Tamiflu.  Based upon your symptoms and potential risk factors I have prescribed Oseltamivir (Tamiflu).  It has been sent to your designated pharmacy.  You will take one 75 mg capsule orally twice a day for the next 5 days. and I recommend that you follow the flu symptoms recommendation that I have listed below.  Please keep well-hydrated and try to get plenty of rest. If you have a humidifier, place it in the bedroom and run it at night. Start a saline nasal rinse for nasal congestion. You can consider use of a nasal steroid spray like Flonase or Nasacort OTC. You can alternate between Tylenol and Ibuprofen if needed for fever, body aches, headache and/or throat pain. Salt water-gargles and chloraseptic spray  can be very beneficial for sore throat. Mucinex-DM for congestion or cough. Please take all prescribed medications as directed.  Remain out of work until [employer] for 24 hours without a fever-reducing medication, and you are feeling better.  You should mask until symptoms are resolved.  If anything worsens despite treatment, you need to be evaluated in-person. Please do not delay care.   ANYONE WHO HAS FLU SYMPTOMS SHOULD: Stay home. The flu is highly contagious and going out or to work exposes others! Be sure to drink plenty of fluids. Water is fine as well as fruit juices, sodas and electrolyte beverages. You may want to stay away from caffeine or alcohol. If you are nauseated, try taking small sips of liquids. How do you know if you are getting enough fluid? Your urine should be a pale yellow or almost colorless. Get rest. Taking a steamy shower or using a humidifier may help nasal congestion and ease sore throat pain. Using a saline nasal spray works much the same way. Cough drops, hard candies and sore throat lozenges may ease your cough. Line up a caregiver. Have someone check on you regularly.   GET HELP RIGHT AWAY IF: You cannot keep down liquids or your medications. You become short of breath Your fell like you are going to pass out or loose consciousness. Your symptoms persist after you have completed your treatment plan MAKE SURE YOU  Understand these instructions. Will watch your condition. Will get help right away if you are not  doing well or get worse.  Your e-visit answers were reviewed by a board certified advanced clinical practitioner to complete your personal care plan.  Depending on the condition, your plan could have included both over the counter or prescription medications.  If there is a problem please reply  once you have received a response from your provider.  Your safety is important to [place].  If you have drug allergies check your prescription carefully.     You can use MyChart to ask questions about today's visit, request a non-urgent call back, or ask for a work or school excuse for 24 hours related to this e-Visit. If it has been greater than 24 hours you will need to follow up with your provider, or enter a new e-Visit to address those concerns.  You will get an e-mail in the next two days asking about your experience.  I hope that your e-visit has been valuable and will speed your recovery. Thank you for using e-visits.

## 2024-01-10 NOTE — Progress Notes (Signed)
 I have spent 5 minutes in review of e-visit questionnaire, review and updating patient chart, medical decision making and response to patient.   Piedad Climes, PA-C

## 2024-01-16 ENCOUNTER — Other Ambulatory Visit (HOSPITAL_COMMUNITY): Payer: Self-pay

## 2024-01-16 ENCOUNTER — Other Ambulatory Visit: Payer: Self-pay

## 2024-01-16 ENCOUNTER — Other Ambulatory Visit: Payer: Self-pay | Admitting: Family Medicine

## 2024-01-16 MED ORDER — MELOXICAM 15 MG PO TABS
15.0000 mg | ORAL_TABLET | Freq: Every day | ORAL | 1 refills | Status: DC
  Filled 2024-01-16: qty 90, 90d supply, fill #0
  Filled 2024-04-15: qty 90, 90d supply, fill #1

## 2024-01-16 NOTE — Telephone Encounter (Signed)
 Requested Prescriptions   Pending Prescriptions Disp Refills   meloxicam (MOBIC) 15 MG tablet 90 tablet 1    Sig: Take 1 tablet (15 mg total) by mouth daily.     Date of patient request: 01/16/2024 Last office visit: 12/22/2023 Upcoming visit: 06/28/2024 Date of last refill: 06/20/2023 Last refill amount: 90

## 2024-02-06 DIAGNOSIS — Z1389 Encounter for screening for other disorder: Secondary | ICD-10-CM | POA: Diagnosis not present

## 2024-02-06 DIAGNOSIS — Z1231 Encounter for screening mammogram for malignant neoplasm of breast: Secondary | ICD-10-CM | POA: Diagnosis not present

## 2024-02-06 DIAGNOSIS — N393 Stress incontinence (female) (male): Secondary | ICD-10-CM | POA: Diagnosis not present

## 2024-02-06 DIAGNOSIS — Z01419 Encounter for gynecological examination (general) (routine) without abnormal findings: Secondary | ICD-10-CM | POA: Diagnosis not present

## 2024-02-06 LAB — HM MAMMOGRAPHY

## 2024-02-20 ENCOUNTER — Other Ambulatory Visit (HOSPITAL_COMMUNITY): Payer: Self-pay

## 2024-02-20 ENCOUNTER — Other Ambulatory Visit: Payer: Self-pay | Admitting: Family Medicine

## 2024-02-20 DIAGNOSIS — F419 Anxiety disorder, unspecified: Secondary | ICD-10-CM

## 2024-02-20 MED ORDER — SERTRALINE HCL 100 MG PO TABS
100.0000 mg | ORAL_TABLET | Freq: Every day | ORAL | 1 refills | Status: DC
  Filled 2024-02-20: qty 90, 90d supply, fill #0
  Filled 2024-05-18: qty 90, 90d supply, fill #1

## 2024-02-20 MED ORDER — ATORVASTATIN CALCIUM 10 MG PO TABS
10.0000 mg | ORAL_TABLET | Freq: Every day | ORAL | 0 refills | Status: DC
Start: 2024-02-20 — End: 2024-05-18
  Filled 2024-02-20: qty 90, 90d supply, fill #0

## 2024-02-21 ENCOUNTER — Other Ambulatory Visit (HOSPITAL_COMMUNITY): Payer: Self-pay

## 2024-02-21 ENCOUNTER — Other Ambulatory Visit: Payer: Self-pay

## 2024-02-23 ENCOUNTER — Ambulatory Visit: Admitting: Physical Therapy

## 2024-03-11 ENCOUNTER — Telehealth: Admitting: Physician Assistant

## 2024-03-11 DIAGNOSIS — J309 Allergic rhinitis, unspecified: Secondary | ICD-10-CM

## 2024-03-11 MED ORDER — LEVOCETIRIZINE DIHYDROCHLORIDE 5 MG PO TABS
5.0000 mg | ORAL_TABLET | Freq: Every evening | ORAL | 0 refills | Status: DC
  Filled 2024-03-11: qty 10, 10d supply, fill #0

## 2024-03-11 MED ORDER — OLOPATADINE HCL 0.2 % OP SOLN
1.0000 [drp] | Freq: Every day | OPHTHALMIC | 0 refills | Status: AC
  Filled 2024-03-11: qty 2.5, 25d supply, fill #0

## 2024-03-11 NOTE — Progress Notes (Signed)
 I have spent 5 minutes in review of e-visit questionnaire, review and updating patient chart, medical decision making and response to patient.   Laure Kidney, PA-C

## 2024-03-12 ENCOUNTER — Other Ambulatory Visit (HOSPITAL_COMMUNITY): Payer: Self-pay

## 2024-03-12 ENCOUNTER — Other Ambulatory Visit: Payer: Self-pay

## 2024-04-15 ENCOUNTER — Other Ambulatory Visit: Payer: Self-pay | Admitting: Family Medicine

## 2024-04-15 ENCOUNTER — Other Ambulatory Visit: Payer: Self-pay

## 2024-04-17 MED ORDER — ALPRAZOLAM 0.5 MG PO TABS
0.5000 mg | ORAL_TABLET | Freq: Two times a day (BID) | ORAL | 1 refills | Status: DC | PRN
  Filled 2024-04-17: qty 30, 15d supply, fill #0
  Filled 2024-07-24: qty 30, 15d supply, fill #1

## 2024-04-17 MED ORDER — PANTOPRAZOLE SODIUM 40 MG PO TBEC
40.0000 mg | DELAYED_RELEASE_TABLET | Freq: Every day | ORAL | 1 refills | Status: DC
  Filled 2024-04-17: qty 90, 90d supply, fill #0
  Filled 2024-07-24: qty 90, 90d supply, fill #1

## 2024-04-17 NOTE — Telephone Encounter (Signed)
 Requested Prescriptions   Pending Prescriptions Disp Refills   ALPRAZolam  (XANAX ) 0.5 MG tablet 30 tablet 1    Sig: Take 1 tablet by mouth 2 times daily as needed for anxiety.   pantoprazole  (PROTONIX ) 40 MG tablet 90 tablet 1    Sig: Take 1 tablet (40 mg total) by mouth daily.     Date of patient request: 04/17/2024 Last office visit: 12/22/2023 Upcoming visit: 06/28/2024 Date of last refill: 10/17/2023 Last refill amount: 30, 90

## 2024-04-18 ENCOUNTER — Other Ambulatory Visit (HOSPITAL_COMMUNITY): Payer: Self-pay

## 2024-04-18 ENCOUNTER — Other Ambulatory Visit: Payer: Self-pay

## 2024-04-26 ENCOUNTER — Ambulatory Visit: Admitting: Physical Therapy

## 2024-04-26 ENCOUNTER — Telehealth: Admitting: Physician Assistant

## 2024-04-26 DIAGNOSIS — B9789 Other viral agents as the cause of diseases classified elsewhere: Secondary | ICD-10-CM | POA: Diagnosis not present

## 2024-04-26 DIAGNOSIS — J329 Chronic sinusitis, unspecified: Secondary | ICD-10-CM

## 2024-04-26 MED ORDER — LEVOCETIRIZINE DIHYDROCHLORIDE 5 MG PO TABS: 5.0000 mg | ORAL_TABLET | Freq: Every evening | ORAL | 0 refills | Status: AC

## 2024-04-26 MED ORDER — AZELASTINE HCL 0.1 % NA SOLN: 2.0000 | Freq: Two times a day (BID) | NASAL | 12 refills | Status: AC

## 2024-04-26 NOTE — Progress Notes (Signed)
E-Visit for Sinus Problems  We are sorry that you are not feeling well.  Here is how we plan to help!  Based on what you have shared with me it looks like you have sinusitis.  Sinusitis is inflammation and infection in the sinus cavities of the head.  Based on your presentation I believe you most likely have Acute Viral Sinusitis.This is an infection most likely caused by a virus. There is not specific treatment for viral sinusitis other than to help you with the symptoms until the infection runs its course.  You may use an oral decongestant such as Mucinex D or if you have glaucoma or high blood pressure use plain Mucinex. Saline nasal spray help and can safely be used as often as needed for congestion, I have prescribed: Azelastine nasal spray 2 sprays in each nostril twice a day  Some authorities believe that zinc sprays or the use of Echinacea may shorten the course of your symptoms.  Sinus infections are not as easily transmitted as other respiratory infection, however we still recommend that you avoid close contact with loved ones, especially the very young and elderly.  Remember to wash your hands thoroughly throughout the day as this is the number one way to prevent the spread of infection!  Home Care: Only take medications as instructed by your medical team. Do not take these medications with alcohol. A steam or ultrasonic humidifier can help congestion.  You can place a towel over your head and breathe in the steam from hot water coming from a faucet. Avoid close contacts especially the very young and the elderly. Cover your mouth when you cough or sneeze. Always remember to wash your hands.  Get Help Right Away If: You develop worsening fever or sinus pain. You develop a severe head ache or visual changes. Your symptoms persist after you have completed your treatment plan.  Make sure you Understand these instructions. Will watch your condition. Will get help right away if you are  not doing well or get worse.   Thank you for choosing an e-visit.  Your e-visit answers were reviewed by a board certified advanced clinical practitioner to complete your personal care plan. Depending upon the condition, your plan could have included both over the counter or prescription medications.  Please review your pharmacy choice. Make sure the pharmacy is open so you can pick up prescription now. If there is a problem, you may contact your provider through MyChart messaging and have the prescription routed to another pharmacy.  Your safety is important to us. If you have drug allergies check your prescription carefully.   For the next 24 hours you can use MyChart to ask questions about today's visit, request a non-urgent call back, or ask for a work or school excuse. You will get an email in the next two days asking about your experience. I hope that your e-visit has been valuable and will speed your recovery.   

## 2024-04-26 NOTE — Progress Notes (Signed)
 I have spent 5 minutes in review of e-visit questionnaire, review and updating patient chart, medical decision making and response to patient.   Laure Kidney, PA-C

## 2024-05-18 ENCOUNTER — Other Ambulatory Visit: Payer: Self-pay | Admitting: Family Medicine

## 2024-05-18 ENCOUNTER — Other Ambulatory Visit (HOSPITAL_COMMUNITY): Payer: Self-pay

## 2024-05-18 ENCOUNTER — Other Ambulatory Visit: Payer: Self-pay

## 2024-05-18 MED ORDER — METFORMIN HCL 500 MG PO TABS
500.0000 mg | ORAL_TABLET | Freq: Every day | ORAL | 1 refills | Status: AC
  Filled 2024-05-18: qty 90, 90d supply, fill #0
  Filled 2024-08-29: qty 90, 90d supply, fill #1

## 2024-05-18 MED ORDER — ATORVASTATIN CALCIUM 10 MG PO TABS
10.0000 mg | ORAL_TABLET | Freq: Every day | ORAL | 0 refills | Status: DC
  Filled 2024-05-18: qty 90, 90d supply, fill #0

## 2024-05-23 ENCOUNTER — Other Ambulatory Visit (HOSPITAL_COMMUNITY): Payer: Self-pay

## 2024-05-29 ENCOUNTER — Other Ambulatory Visit (HOSPITAL_COMMUNITY): Payer: Self-pay

## 2024-06-28 ENCOUNTER — Encounter: Payer: Commercial Managed Care - PPO | Admitting: Family Medicine

## 2024-07-24 ENCOUNTER — Other Ambulatory Visit: Payer: Self-pay

## 2024-07-24 ENCOUNTER — Other Ambulatory Visit: Payer: Self-pay | Admitting: Family Medicine

## 2024-07-24 ENCOUNTER — Other Ambulatory Visit (HOSPITAL_COMMUNITY): Payer: Self-pay

## 2024-07-24 MED ORDER — ATORVASTATIN CALCIUM 10 MG PO TABS
10.0000 mg | ORAL_TABLET | Freq: Every day | ORAL | 0 refills | Status: DC
  Filled 2024-07-24 – 2024-08-29 (×2): qty 90, 90d supply, fill #0

## 2024-07-25 ENCOUNTER — Other Ambulatory Visit (HOSPITAL_COMMUNITY): Payer: Self-pay

## 2024-07-25 ENCOUNTER — Other Ambulatory Visit: Payer: Self-pay

## 2024-07-25 MED ORDER — MELOXICAM 15 MG PO TABS
15.0000 mg | ORAL_TABLET | Freq: Every day | ORAL | 1 refills | Status: AC
  Filled 2024-07-25: qty 90, 90d supply, fill #0
  Filled 2024-11-26: qty 90, 90d supply, fill #1

## 2024-07-27 ENCOUNTER — Other Ambulatory Visit: Payer: Self-pay | Admitting: Medical Genetics

## 2024-08-02 ENCOUNTER — Encounter: Payer: Self-pay | Admitting: Family Medicine

## 2024-08-02 ENCOUNTER — Ambulatory Visit (INDEPENDENT_AMBULATORY_CARE_PROVIDER_SITE_OTHER): Admitting: Family Medicine

## 2024-08-02 VITALS — BP 122/80 | HR 63 | Temp 98.4°F | Ht 65.0 in | Wt 219.0 lb

## 2024-08-02 DIAGNOSIS — R7303 Prediabetes: Secondary | ICD-10-CM | POA: Diagnosis not present

## 2024-08-02 DIAGNOSIS — M2550 Pain in unspecified joint: Secondary | ICD-10-CM

## 2024-08-02 DIAGNOSIS — Z0000 Encounter for general adult medical examination without abnormal findings: Secondary | ICD-10-CM

## 2024-08-02 DIAGNOSIS — E559 Vitamin D deficiency, unspecified: Secondary | ICD-10-CM | POA: Diagnosis not present

## 2024-08-02 LAB — BASIC METABOLIC PANEL WITH GFR
BUN: 17 mg/dL (ref 6–23)
CO2: 28 meq/L (ref 19–32)
Calcium: 9.6 mg/dL (ref 8.4–10.5)
Chloride: 103 meq/L (ref 96–112)
Creatinine, Ser: 0.74 mg/dL (ref 0.40–1.20)
GFR: 86.64 mL/min (ref >60.00)
Glucose, Bld: 80 mg/dL (ref 70–99)
Potassium: 4.3 meq/L (ref 3.5–5.1)
Sodium: 140 meq/L (ref 135–145)

## 2024-08-02 LAB — CBC WITH DIFFERENTIAL/PLATELET
Basophils Absolute: 0.1 K/uL (ref 0.0–0.1)
Basophils Relative: 1.1 % (ref 0.0–3.0)
Eosinophils Absolute: 0.1 K/uL (ref 0.0–0.7)
Eosinophils Relative: 1.6 % (ref 0.0–5.0)
HCT: 44 % (ref 36.0–46.0)
Hemoglobin: 14.4 g/dL (ref 12.0–15.0)
Lymphocytes Relative: 27.3 % (ref 12.0–46.0)
Lymphs Abs: 1.3 K/uL (ref 0.7–4.0)
MCHC: 32.7 g/dL (ref 30.0–36.0)
MCV: 93.6 fl (ref 78.0–100.0)
Monocytes Absolute: 0.4 K/uL (ref 0.1–1.0)
Monocytes Relative: 8.2 % (ref 3.0–12.0)
Neutro Abs: 2.9 K/uL (ref 1.4–7.7)
Neutrophils Relative %: 61.8 % (ref 43.0–77.0)
Platelets: 227 K/uL (ref 150.0–400.0)
RBC: 4.71 Mil/uL (ref 3.87–5.11)
RDW: 13.3 % (ref 11.5–15.5)
WBC: 4.7 K/uL (ref 4.0–10.5)

## 2024-08-02 LAB — HEPATIC FUNCTION PANEL
ALT: 20 U/L (ref 0–35)
AST: 15 U/L (ref 0–37)
Albumin: 4.5 g/dL (ref 3.5–5.2)
Alkaline Phosphatase: 61 U/L (ref 39–117)
Bilirubin, Direct: 0.1 mg/dL (ref 0.0–0.3)
Total Bilirubin: 0.5 mg/dL (ref 0.2–1.2)
Total Protein: 7.2 g/dL (ref 6.0–8.3)

## 2024-08-02 LAB — HEMOGLOBIN A1C: Hgb A1c MFr Bld: 6 % (ref 4.6–6.5)

## 2024-08-02 LAB — LIPID PANEL
Cholesterol: 191 mg/dL (ref 0–200)
HDL: 79.8 mg/dL (ref >39.00)
LDL Cholesterol: 97 mg/dL (ref 0–99)
NonHDL: 110.79
Total CHOL/HDL Ratio: 2
Triglycerides: 68 mg/dL (ref 0.0–149.0)
VLDL: 13.6 mg/dL (ref 0.0–40.0)

## 2024-08-02 LAB — TSH: TSH: 2.07 u[IU]/mL (ref 0.35–5.50)

## 2024-08-02 LAB — SEDIMENTATION RATE: Sed Rate: 11 mm/h (ref 0–30)

## 2024-08-02 LAB — VITAMIN D 25 HYDROXY (VIT D DEFICIENCY, FRACTURES): VITD: 30.56 ng/mL (ref 30.00–100.00)

## 2024-08-02 NOTE — Progress Notes (Signed)
   Subjective:    Patient ID: Tracy Pitts, female    DOB: 13-Mar-1961, 63 y.o.   MRN: [id number]  HPI CPE- UTD on mammo, pap, colonoscopy, Tdap.  Patient Care Team    Relationship Specialty Notifications Start End  Mahlon Comer BRAVO, MD PCP - General Family Medicine  05/20/17   Shlomo Wilbert SAUNDERS, MD PCP - Cardiology Cardiology Admissions 06/08/18   Estelle Service, MD Consulting Physician Obstetrics and Gynecology  05/20/17   Abran Norleen SAILOR, MD Consulting Physician Gastroenterology  05/20/17     Health Maintenance  Topic Date Due   Pneumococcal Vaccine: 50+ Years (1 of 1 - PCV) Never done   Mammogram  09/04/2023   Influenza Vaccine  06/22/2024   COVID-19 Vaccine (3 - 2025-26 season) 07/23/2024   Cervical Cancer Screening (HPV/Pap Cotest)  09/03/2026   DTaP/Tdap/Td (2 - Td or Tdap) 02/18/2027   Colonoscopy  07/04/2030   Hepatitis C Screening  Completed   HIV Screening  Completed   Zoster Vaccines- Shingrix   Completed   Hepatitis B Vaccines 19-59 Average Risk  Aged Out   HPV VACCINES  Aged Out   Meningococcal B Vaccine  Aged Out      Review of Systems Patient reports no vision/ hearing changes, adenopathy,fever, weight change,  persistant/recurrent hoarseness , swallowing issues, chest pain, palpitations, edema, persistant/recurrent cough, hemoptysis, dyspnea (rest/exertional/paroxysmal nocturnal), gastrointestinal bleeding (melena, rectal bleeding), abdominal pain, significant heartburn, bowel changes, GU symptoms (dysuria, hematuria, incontinence), Gyn symptoms (abnormal  bleeding, pain),  syncope, focal weakness, memory loss, numbness & tingling, skin/hair/nail changes, abnormal bruising or bleeding, anxiety, or depression.   'there are days I just hurt all over'    Objective:   Physical Exam General Appearance:    Alert, cooperative, no distress, appears stated age  Head:    Normocephalic, without obvious abnormality, atraumatic  Eyes:    PERRL, conjunctiva/corneas clear,  EOM's intact both eyes  Ears:    Normal TM's and external ear canals, both ears  Nose:   Nares normal, septum midline, mucosa normal, no drainage    or sinus tenderness  Throat:   Lips, mucosa, and tongue normal; teeth and gums normal  Neck:   Supple, symmetrical, trachea midline, no adenopathy;    Thyroid : no enlargement/tenderness/nodules  Back:     Symmetric, no curvature, ROM normal, no CVA tenderness  Lungs:     Clear to auscultation bilaterally, respirations unlabored  Chest Wall:    No tenderness or deformity   Heart:    Regular rate and rhythm, S1 and S2 normal, no murmur, rub   or gallop  Breast Exam:    Deferred to GYN  Abdomen:     Soft, non-tender, bowel sounds active all four quadrants,    no masses, no organomegaly  Genitalia:    Deferred to GYN  Rectal:    Extremities:   Extremities normal, atraumatic, no cyanosis or edema  Pulses:   2+ and symmetric all extremities  Skin:   Skin color, texture, turgor normal, no rashes or lesions  Lymph nodes:   Cervical, supraclavicular, and axillary nodes normal  Neurologic:   CNII-XII intact, normal strength, sensation and reflexes    throughout          Assessment & Plan:

## 2024-08-02 NOTE — Patient Instructions (Signed)
Follow up in 6 months to recheck cholesterol We'll notify you of your lab results and make any changes if needed Continue to work on healthy diet and regular exercise- you can do it!! Call with any questions or concerns Stay Safe!  Stay Healthy! Happy Fall!!

## 2024-08-02 NOTE — Assessment & Plan Note (Signed)
Pt's PE WNL w/ exception of BMI.  UTD on pap, mammo, colonoscopy, Tdap.  Check labs.  Anticipatory guidance provided.  

## 2024-08-03 ENCOUNTER — Ambulatory Visit: Payer: Self-pay | Admitting: Family Medicine

## 2024-08-03 ENCOUNTER — Encounter: Payer: Self-pay | Admitting: Obstetrics and Gynecology

## 2024-08-04 LAB — ANA: Anti Nuclear Antibody (ANA): NEGATIVE

## 2024-08-06 NOTE — Progress Notes (Signed)
 Pt has reviewed via MyChart

## 2024-08-08 ENCOUNTER — Telehealth: Admitting: Physician Assistant

## 2024-08-08 ENCOUNTER — Ambulatory Visit: Payer: Self-pay

## 2024-08-08 ENCOUNTER — Encounter: Payer: Self-pay | Admitting: Family Medicine

## 2024-08-08 ENCOUNTER — Other Ambulatory Visit: Payer: Self-pay | Admitting: Family Medicine

## 2024-08-08 ENCOUNTER — Ambulatory Visit: Admitting: Family Medicine

## 2024-08-08 VITALS — BP 152/100 | HR 74 | Wt 220.0 lb

## 2024-08-08 DIAGNOSIS — R1030 Lower abdominal pain, unspecified: Secondary | ICD-10-CM

## 2024-08-08 DIAGNOSIS — R350 Frequency of micturition: Secondary | ICD-10-CM | POA: Diagnosis not present

## 2024-08-08 DIAGNOSIS — R300 Dysuria: Secondary | ICD-10-CM | POA: Diagnosis not present

## 2024-08-08 DIAGNOSIS — R6883 Chills (without fever): Secondary | ICD-10-CM

## 2024-08-08 DIAGNOSIS — R112 Nausea with vomiting, unspecified: Secondary | ICD-10-CM

## 2024-08-08 DIAGNOSIS — R509 Fever, unspecified: Secondary | ICD-10-CM

## 2024-08-08 DIAGNOSIS — R319 Hematuria, unspecified: Secondary | ICD-10-CM | POA: Diagnosis not present

## 2024-08-08 LAB — CBC
HCT: 43.3 % (ref 36.0–46.0)
Hemoglobin: 14.3 g/dL (ref 12.0–15.0)
MCHC: 32.9 g/dL (ref 30.0–36.0)
MCV: 92.8 fl (ref 78.0–100.0)
Platelets: 229 K/uL (ref 150.0–400.0)
RBC: 4.67 Mil/uL (ref 3.87–5.11)
RDW: 13.2 % (ref 11.5–15.5)
WBC: 8.5 K/uL (ref 4.0–10.5)

## 2024-08-08 LAB — BASIC METABOLIC PANEL WITH GFR
BUN: 18 mg/dL (ref 6–23)
CO2: 26 meq/L (ref 19–32)
Calcium: 9.5 mg/dL (ref 8.4–10.5)
Chloride: 104 meq/L (ref 96–112)
Creatinine, Ser: 0.81 mg/dL (ref 0.40–1.20)
GFR: 77.73 mL/min (ref >60.00)
Glucose, Bld: 102 mg/dL — ABNORMAL HIGH (ref 70–99)
Potassium: 4.1 meq/L (ref 3.5–5.1)
Sodium: 139 meq/L (ref 135–145)

## 2024-08-08 LAB — POCT URINALYSIS DIPSTICK
Bilirubin, UA: NEGATIVE
Glucose, UA: NEGATIVE
Ketones, UA: NEGATIVE
Leukocytes, UA: NEGATIVE
Nitrite, UA: NEGATIVE
Protein, UA: NEGATIVE
Spec Grav, UA: 1.01 (ref 1.010–1.025)
Urobilinogen, UA: 0.2 U/dL
pH, UA: 6 (ref 5.0–8.0)

## 2024-08-08 MED ORDER — SULFAMETHOXAZOLE-TRIMETHOPRIM 800-160 MG PO TABS: 1.0000 | ORAL_TABLET | Freq: Two times a day (BID) | ORAL | 0 refills | Status: AC

## 2024-08-08 NOTE — Telephone Encounter (Signed)
 Patient was triaged and has an appointment today with Dr.Greene

## 2024-08-08 NOTE — Telephone Encounter (Signed)
 Appt today with Dr Levora for Urinary frequency

## 2024-08-08 NOTE — Patient Instructions (Addendum)
 Based on your symptoms I am suspicious for urinary tract infection but surprisingly nitrite and leukocyte esterase were both negative on urine testing today, but blood was noted.  Differential includes a kidney stone, but covering for infection with your symptoms for now would be reasonable with watching urine culture for changes.  I did order a CT urogram as well to evaluate for kidney stone.  That should show bladder thickening if this is all due to a bladder infection.  If you have any further progression of bowel changes, worsening abdominal pain, fevers or other worsening symptoms let me know as you may need other imaging at that time but unlikely to occur.  Keep us  posted.  Hope you feel better soon!

## 2024-08-08 NOTE — Progress Notes (Signed)
  Because of the severity of your symptoms and having associated fevers, pain and vomiting, I feel your condition warrants further evaluation and I recommend that you be seen in a face-to-face visit. It is possible you may have a more severe infection called Pyelonephritis. This is an infection of the kidney and can cause sepsis. It is recommended to be evaluated as soon as possible to rule this out.    NOTE: There will be NO CHARGE for this E-Visit   If you are having a true medical emergency, please call 911.     For an urgent face to face visit, [facility] has multiple urgent care centers for your convenience.  Click the link below for the full list of locations and hours, walk-in wait times, appointment scheduling options and driving directions:  Urgent Care - [place], [place], [place], [place], [place], [place]  [place]     Your MyChart E-visit questionnaire answers were reviewed by a board certified advanced clinical practitioner to complete your personal care plan based on your specific symptoms.    Thank you for using e-Visits.     I have spent 5 minutes in review of e-visit questionnaire, review and updating patient chart, medical decision making and response to patient.   Delon CHRISTELLA Dickinson, PA-C

## 2024-08-08 NOTE — Telephone Encounter (Signed)
 FYI Only or Action Required?: FYI only for provider.  Patient was last seen in primary care on 08/02/2024 by Mahlon Comer BRAVO, MD.  Called Nurse Triage reporting Urinary Frequency.  Symptoms began today.  Interventions attempted: Nothing.  Symptoms are: unchanged.  Triage Disposition: See HCP Within 4 Hours (Or PCP Triage)  Patient/caregiver understands and will follow disposition?: Yes   Copied from CRM [id number]. Topic: Clinical - Red Word Triage >> Aug 08, 2024  8:32 AM Rosina BIRCH wrote: Reason for RMF:enddpaoz UTI with pain, chills, urine frequency, nauseated and threw up Reason for Disposition  [1] SEVERE pain with urination (e.g., excruciating) AND [2] not improved after 2 hours of pain medicine  Answer Assessment - Initial Assessment Questions 1. SEVERITY: How bad is the pain?  (e.g., Scale 1-10; mild, moderate, or severe)     Pain in lower pelvic area; chills, urine frequency, nauseated, vomiting  2. FREQUENCY: How many times have you had painful urination today?      twice 3. PATTERN: Is pain present every time you urinate or just sometimes?      yes 4. ONSET: When did the painful urination start?      This am 5. FEVER: Do you have a fever? If Yes, ask: What is your temperature, how was it measured, and when did it start?     chills 6. PAST UTI: Have you had a urine infection before? If Yes, ask: When was the last time? and What happened that time?      yes 7. CAUSE: What do you think is causing the painful urination?  (e.g., UTI, scratch, Herpes sore)     uti 8. OTHER SYMPTOMS: Do you have any other symptoms? (e.g., blood in urine, flank pain, genital sores, urgency, vaginal discharge)     denies 9. PREGNANCY: Is there any chance you are pregnant? When was your last menstrual period?     na  Protocols used: Urination Pain - Female-A-AH

## 2024-08-08 NOTE — Progress Notes (Signed)
 Subjective:  Patient ID: Tracy Pitts, female    DOB: [birth year]  Age: 63 y.o. MRN: [id number]  CC:  Chief Complaint  Patient presents with   Dysuria    2am this morning patient was woken up with pain in uterine area. Patient went to urinate and there was pain when urinating and not much urine. Burning sensation. Pain near bladder area and was nauseated and chills. No fevers according to patient thermometer. Frequent urination.     HPI Tracy Pitts presents for   Dysuria Noted at 2 AM with lower abdominal discomfort - woke up out of sleep.  Pain with urination at that time, minimal urination initially, burning sensation lower abdomen.  Felt nausea once,  chills but no fevers.  Frequent urination since that time. Initial e-visit attempted this morning, concern for pyelonephritis, recommended in person visit. 2 bowel movements today but normal consistency, no blood.  Able to drink fluids. History of diverticulosis on prior colonoscopy but not diverticulitis  Tx: tylenol  - some improvement in pain.   Similar sx's few months ago with UTI, no hx of pyelonephritis, no hx of nephrolithiasis, but father with hx of nephrolith   History Patient Active Problem List   Diagnosis Date Noted   Osteoarthritis of right hip 07/08/2020   S/P left THA, AA 06/20/2018   Heart palpitations 05/09/2018   Excessive daytime sleepiness 05/09/2018   Morbid obesity (HCC) 05/09/2018   Pain of left hip joint 05/01/2018   Physical exam 03/21/2018   Vitamin D  deficiency 03/21/2018   Atrophic vaginitis 02/26/2016   Hyperlipidemia 12/27/2014   Pre-diabetes 12/25/2014   Anxiety and depression 12/25/2014   Past Medical History:  Diagnosis Date   Allergy    Anxiety    Arthritis    LEFT HIP    Complication of anesthesia    SOME NAUSEA WITH PRIOR C SECTION   Depression    Dr Randolm   GERD (gastroesophageal reflux disease)    PONV (postoperative nausea and vomiting)    Pre-diabetes    Prediabetes     Wears glasses    Past Surgical History:  Procedure Laterality Date   BREAST BIOPSY Right 1989    Benign   CESAREAN SECTION  1994   CHOLECYSTECTOMY N/A 02/17/2015   Procedure: LAPAROSCOPIC CHOLECYSTECTOMY WITH INTRAOPERATIVE CHOLANGIOGRAM;  Surgeon: Krystal Spinner, MD;  Location: [facility];  Service: General;  Laterality: N/A;   COLONOSCOPY WITH PROPOFOL   02-21-2013   JOINT REPLACEMENT     TOTAL HIP ARTHROPLASTY Left 06/20/2018   Procedure: LEFT TOTAL HIP ARTHROPLASTY ANTERIOR APPROACH;  Surgeon: Ernie Cough, MD;  Location: [facility];  Service: Orthopedics;  Laterality: Left;  70 mins   TOTAL HIP ARTHROPLASTY Right 07/08/2020   Procedure: TOTAL HIP ARTHROPLASTY ANTERIOR APPROACH;  Surgeon: Ernie Cough, MD;  Location: [facility];  Service: Orthopedics;  Laterality: Right;  70 mins   No Known Allergies Prior to Admission medications   Medication Sig Start Date End Date Taking? Authorizing Provider  ALPRAZolam  (XANAX ) 0.5 MG tablet Take 1 tablet by mouth 2 times daily as needed for anxiety. 04/17/24   Tabori, Katherine E, MD  atorvastatin  (LIPITOR) 10 MG tablet Take 1 tablet (10 mg total) by mouth daily. 07/24/24   Tabori, Katherine E, MD  azelastine  (ASTELIN ) 0.1 % nasal spray Place 2 sprays into both nostrils 2 (two) times daily. Use in each nostril as directed 04/26/24   Rolan Berthold, PA-C  buPROPion  (WELLBUTRIN  XL) 300 MG 24 hr tablet Take  1 tablet (300 mg total) by mouth daily. 09/01/23   Tabori, Katherine E, MD  levocetirizine (XYZAL  ALLERGY 24HR) 5 MG tablet Take 1 tablet (5 mg total) by mouth every evening for 10 days. 04/26/24 05/06/24  Rolan Berthold, PA-C  meloxicam  (MOBIC ) 15 MG tablet Take 1 tablet (15 mg total) by mouth daily. 07/25/24   Tabori, Katherine E, MD  metFORMIN  (GLUCOPHAGE ) 500 MG tablet Take 1 tablet (500 mg total) by mouth daily with breakfast. 05/18/24   Tabori, Katherine E, MD  pantoprazole  (PROTONIX ) 40 MG tablet Take 1 tablet (40 mg total) by mouth daily.  04/17/24   Tabori, Katherine E, MD  Red Yeast Rice 600 MG CAPS Take 600 mg by mouth daily.     [provider]  sertraline  (ZOLOFT ) 100 MG tablet Take 1 tablet (100 mg total) by mouth daily. (need appt for refills) 02/20/24   Tabori, Katherine E, MD  sucralfate  (CARAFATE ) 1 g tablet Take 1 tablet (1 g total) by mouth 4 (four) times daily -  with meals and at bedtime. 12/22/23   Tabori, Katherine E, MD  Vitamin D , Ergocalciferol , (DRISDOL ) 1.25 MG (50000 UNIT) CAPS capsule Take 1 capsule (50,000 Units total) by mouth every 7 (seven) days. Patient not taking: Reported on 08/02/2024 06/18/22   Tabori, Katherine E, MD  vitamin E 400 UNIT capsule Take 400 Units by mouth daily.     [provider]   Social History   Socioeconomic History   Marital status: Married    Spouse name: Not on file   Number of children: Not on file   Years of education: Not on file   Highest education level: Bachelor's degree (e.g., BA, AB, BS)  Occupational History   Not on file  Tobacco Use   Smoking status: Never   Smokeless tobacco: Never  Vaping Use   Vaping status: Never Used  Substance and Sexual Activity   Alcohol use: Yes    Comment: occasional   Drug use: No   Sexual activity: Yes  Other Topics Concern   Not on file  Social History Narrative   Not on file   Social Drivers of Health   Financial Resource Strain: Low Risk  (08/01/2024)   Overall Financial Resource Strain (CARDIA)    Difficulty of Paying Living Expenses: Not hard at all  Food Insecurity: No Food Insecurity (08/01/2024)   Hunger Vital Sign    Worried About Running Out of Food in the Last Year: Never true    Ran Out of Food in the Last Year: Never true  Transportation Needs: No Transportation Needs (08/01/2024)   PRAPARE - Administrator, Civil Service (Medical): No    Lack of Transportation (Non-Medical): No  Physical Activity: Sufficiently Active (08/01/2024)   Exercise Vital Sign    Days of Exercise per  Week: 3 days    Minutes of Exercise per Session: 60 min  Stress: Stress Concern Present (08/01/2024)   [employer] of Occupational Health - Occupational Stress Questionnaire    Feeling of Stress: To some extent  Social Connections: Moderately Integrated (08/01/2024)   Social Connection and Isolation Panel    Frequency of Communication with Friends and Family: More than three times a week    Frequency of Social Gatherings with Friends and Family: Once a week    Attends Religious Services: 1 to 4 times per year    Active Member of [employer] or Organizations: No    Attends Banker Meetings: Not on  file    Marital Status: Married  Catering manager Violence: Not on file    Review of Systems Per HPI  Objective:   Vitals:   08/08/24 1311  BP: (!) 150/84  Pulse: 74  SpO2: 98%  Weight: 220 lb (99.8 kg)     Physical Exam Constitutional:      General: She is not in acute distress.    Appearance: Normal appearance. She is well-developed.  HENT:     Head: Normocephalic and atraumatic.  Cardiovascular:     Rate and Rhythm: Normal rate.  Pulmonary:     Effort: Pulmonary effort is normal.  Abdominal:     General: There is no distension.     Tenderness: There is abdominal tenderness (suprapubic.). There is no right CVA tenderness or left CVA tenderness.  Neurological:     Mental Status: She is alert and oriented to person, place, and time.  Psychiatric:        Mood and Affect: Mood normal.      Results for orders placed or performed in visit on 08/08/24  POCT urinalysis dipstick   Collection Time: 08/08/24  1:16 PM  Result Value Ref Range   Color, UA yellow    Clarity, UA clear    Glucose, UA Negative Negative   Bilirubin, UA Negative    Ketones, UA Negative    Spec Grav, UA 1.010 1.010 - 1.025   Blood, UA 3+    pH, UA 6.0 5.0 - 8.0   Protein, UA Negative Negative   Urobilinogen, UA 0.2 0.2 or 1.0 E.U./dL   Nitrite, UA Negative    Leukocytes, UA Negative  Negative   Appearance     Odor       Assessment & Plan:  Tracy Pitts is a 63 y.o. female . Frequent urination - Plan: POCT urinalysis dipstick, Urine Culture, sulfamethoxazole -trimethoprim  (BACTRIM  DS) 800-160 MG tablet  Dysuria - Plan: Urine Culture, sulfamethoxazole -trimethoprim  (BACTRIM  DS) 800-160 MG tablet  Lower abdominal pain - Plan: Urine Culture, sulfamethoxazole -trimethoprim  (BACTRIM  DS) 800-160 MG tablet  Hematuria, unspecified type - Plan: CT RENAL STONE STUDY, Basic metabolic panel with GFR  Chills - Plan: CBC  Acute onset of lower abdominal pain this morning with dysuria, frequency, urgency.  symptoms would indicate likely urinary tract infection.  Did have single episode of nausea, chills but no fever.  No CVA tenderness, less likely pyelonephritis at this time.  Surprisingly she did not have nitrite or LE on her urinalysis but did have 3+ blood.  Family history of nephrolithiasis in her father but no personal history.  Differential includes nephrolith plus or minus secondary infection.  Additionally she has a history of sigmoid diverticulosis without sigmoid diverticulitis previously.  This could also create some of the urinary symptoms based on location but would not explain the hematuria.  - For now we will start Bactrim  DS to cover for UTI, check CBC, BMP.  Check urine culture to guide treatment.  - Check CT renal stone study to evaluate for nephrolithiasis, which should also indicate whether or not there is bladder thickening consistent with cystitis, and may be able to see any apparent sigmoid colonic edema if present although less likely diverticulitis.  - Fluids, rest, RTC/ER precautions given, all questions answered.  Elevated blood pressure likely related to discomfort with current symptoms.  Continue to monitor.  Meds ordered this encounter  Medications   sulfamethoxazole -trimethoprim  (BACTRIM  DS) 800-160 MG tablet    Sig: Take 1 tablet by mouth 2 (two)  times daily for 5 days.    Dispense:  10 tablet    Refill:  0   Patient Instructions  Based on your symptoms I am suspicious for urinary tract infection but surprisingly nitrite and leukocyte esterase were both negative on urine testing today, but blood was noted.  Differential includes a kidney stone, but covering for infection with your symptoms for now would be reasonable with watching urine culture for changes.  I did order a CT urogram as well to evaluate for kidney stone.  That should show bladder thickening if this is all due to a bladder infection.  If you have any further progression of bowel changes, worsening abdominal pain, fevers or other worsening symptoms let me know as you may need other imaging at that time but unlikely to occur.  Keep us  posted.  Hope you feel better soon.    Signed,   Reyes Pines, MD [facility], [facility] 08/08/24 2:02 PM

## 2024-08-09 ENCOUNTER — Telehealth: Payer: Self-pay

## 2024-08-09 ENCOUNTER — Ambulatory Visit
Admission: RE | Admit: 2024-08-09 | Discharge: 2024-08-09 | Disposition: A | Discharge status: Home / Self Care | Source: Ambulatory Visit | Attending: Family Medicine | Admitting: Family Medicine

## 2024-08-09 ENCOUNTER — Other Ambulatory Visit

## 2024-08-09 ENCOUNTER — Ambulatory Visit: Payer: Self-pay | Admitting: Family Medicine

## 2024-08-09 DIAGNOSIS — R319 Hematuria, unspecified: Secondary | ICD-10-CM

## 2024-08-09 DIAGNOSIS — N1330 Unspecified hydronephrosis: Secondary | ICD-10-CM | POA: Diagnosis not present

## 2024-08-09 DIAGNOSIS — K5730 Diverticulosis of large intestine without perforation or abscess without bleeding: Secondary | ICD-10-CM | POA: Diagnosis not present

## 2024-08-09 DIAGNOSIS — N200 Calculus of kidney: Secondary | ICD-10-CM

## 2024-08-09 LAB — URINE CULTURE
MICRO NUMBER:: 16980924
Result:: NO GROWTH
SPECIMEN QUALITY:: ADEQUATE

## 2024-08-09 NOTE — Telephone Encounter (Signed)
Reviewed,  see result note.

## 2024-08-09 NOTE — Telephone Encounter (Signed)
 [place] [facility] called asking Dr Levora review CT renal study as soon as possible they report this is abnormal

## 2024-08-10 ENCOUNTER — Other Ambulatory Visit: Payer: Self-pay | Admitting: Family Medicine

## 2024-08-13 NOTE — Telephone Encounter (Signed)
 FYI patient states she passed the kidney stone on Saturday

## 2024-08-29 ENCOUNTER — Other Ambulatory Visit: Payer: Self-pay | Admitting: Family Medicine

## 2024-08-29 ENCOUNTER — Other Ambulatory Visit (HOSPITAL_COMMUNITY): Payer: Self-pay

## 2024-08-29 ENCOUNTER — Other Ambulatory Visit: Payer: Self-pay

## 2024-08-29 DIAGNOSIS — F419 Anxiety disorder, unspecified: Secondary | ICD-10-CM

## 2024-08-29 MED ORDER — BUPROPION HCL ER (XL) 300 MG PO TB24
300.0000 mg | ORAL_TABLET | Freq: Every day | ORAL | 3 refills | Status: AC
  Filled 2024-08-29: qty 90, 90d supply, fill #0
  Filled 2024-11-26: qty 90, 90d supply, fill #1

## 2024-08-29 MED ORDER — SERTRALINE HCL 100 MG PO TABS
100.0000 mg | ORAL_TABLET | Freq: Every day | ORAL | 1 refills | Status: AC
  Filled 2024-08-29: qty 90, 90d supply, fill #0
  Filled 2024-11-26: qty 90, 90d supply, fill #1

## 2024-09-30 ENCOUNTER — Other Ambulatory Visit: Payer: Self-pay | Admitting: Family Medicine

## 2024-10-01 ENCOUNTER — Other Ambulatory Visit (HOSPITAL_COMMUNITY): Payer: Self-pay

## 2024-10-01 MED ORDER — PANTOPRAZOLE SODIUM 40 MG PO TBEC
40.0000 mg | DELAYED_RELEASE_TABLET | Freq: Every day | ORAL | 1 refills | Status: AC
  Filled 2024-10-01 – 2024-10-15 (×2): qty 90, 90d supply, fill #0

## 2024-10-02 ENCOUNTER — Other Ambulatory Visit (HOSPITAL_COMMUNITY): Payer: Self-pay

## 2024-10-15 ENCOUNTER — Other Ambulatory Visit: Payer: Self-pay

## 2024-10-15 ENCOUNTER — Other Ambulatory Visit (HOSPITAL_COMMUNITY): Payer: Self-pay

## 2024-10-15 ENCOUNTER — Other Ambulatory Visit: Payer: Self-pay | Admitting: Family Medicine

## 2024-10-15 MED ORDER — ALPRAZOLAM 0.5 MG PO TABS
0.5000 mg | ORAL_TABLET | Freq: Two times a day (BID) | ORAL | 1 refills | Status: AC | PRN
  Filled 2024-10-15: qty 30, 15d supply, fill #0
  Filled 2024-12-25: qty 30, 15d supply, fill #1

## 2024-10-15 NOTE — Telephone Encounter (Signed)
 Requested Prescriptions   Pending Prescriptions Disp Refills   ALPRAZolam  (XANAX ) 0.5 MG tablet 30 tablet 1    Sig: Take 1 tablet by mouth 2 times daily as needed for anxiety.     Date of patient request: 10/15/2024 Last office visit: 08/02/2024 Upcoming visit: 01/31/2025 Date of last refill: 04/17/2024 Last refill amount: 30

## 2024-11-26 ENCOUNTER — Other Ambulatory Visit (HOSPITAL_COMMUNITY): Payer: Self-pay

## 2024-11-26 ENCOUNTER — Other Ambulatory Visit: Payer: Self-pay

## 2024-11-26 ENCOUNTER — Other Ambulatory Visit: Payer: Self-pay | Admitting: Family Medicine

## 2024-11-26 MED ORDER — ATORVASTATIN CALCIUM 10 MG PO TABS
10.0000 mg | ORAL_TABLET | Freq: Every day | ORAL | 0 refills | Status: AC
  Filled 2024-11-26: qty 90, 90d supply, fill #0

## 2024-12-25 ENCOUNTER — Other Ambulatory Visit: Payer: Self-pay

## 2025-01-31 ENCOUNTER — Ambulatory Visit: Admitting: Family Medicine
